# Patient Record
Sex: Female | Born: 1950 | Race: White | Hispanic: No | Smoking: Never smoker
Health system: Southern US, Community
[De-identification: ages and names within clinical notes are randomized; demographics above are authoritative.]

## PROBLEM LIST (undated history)

## (undated) DIAGNOSIS — F419 Anxiety disorder, unspecified: Secondary | ICD-10-CM

## (undated) DIAGNOSIS — J45909 Unspecified asthma, uncomplicated: Secondary | ICD-10-CM

## (undated) DIAGNOSIS — G629 Polyneuropathy, unspecified: Secondary | ICD-10-CM

## (undated) DIAGNOSIS — G4733 Obstructive sleep apnea (adult) (pediatric): Secondary | ICD-10-CM

## (undated) DIAGNOSIS — F319 Bipolar disorder, unspecified: Secondary | ICD-10-CM

## (undated) DIAGNOSIS — H269 Unspecified cataract: Secondary | ICD-10-CM

## (undated) DIAGNOSIS — G4730 Sleep apnea, unspecified: Secondary | ICD-10-CM

## (undated) DIAGNOSIS — M722 Plantar fascial fibromatosis: Secondary | ICD-10-CM

## (undated) DIAGNOSIS — K7581 Nonalcoholic steatohepatitis (NASH): Secondary | ICD-10-CM

## (undated) DIAGNOSIS — I10 Essential (primary) hypertension: Secondary | ICD-10-CM

## (undated) DIAGNOSIS — I2510 Atherosclerotic heart disease of native coronary artery without angina pectoris: Secondary | ICD-10-CM

## (undated) DIAGNOSIS — R011 Cardiac murmur, unspecified: Secondary | ICD-10-CM

## (undated) DIAGNOSIS — M5430 Sciatica, unspecified side: Secondary | ICD-10-CM

## (undated) DIAGNOSIS — M810 Age-related osteoporosis without current pathological fracture: Secondary | ICD-10-CM

## (undated) DIAGNOSIS — E079 Disorder of thyroid, unspecified: Secondary | ICD-10-CM

## (undated) DIAGNOSIS — L501 Idiopathic urticaria: Secondary | ICD-10-CM

## (undated) DIAGNOSIS — E7800 Pure hypercholesterolemia, unspecified: Secondary | ICD-10-CM

## (undated) DIAGNOSIS — I219 Acute myocardial infarction, unspecified: Secondary | ICD-10-CM

## (undated) DIAGNOSIS — F32A Depression, unspecified: Secondary | ICD-10-CM

## (undated) DIAGNOSIS — T7840XA Allergy, unspecified, initial encounter: Secondary | ICD-10-CM

## (undated) HISTORY — DX: Sciatica, unspecified side: M54.30

## (undated) HISTORY — DX: Atherosclerotic heart disease of native coronary artery without angina pectoris: I25.10

## (undated) HISTORY — DX: Sleep apnea, unspecified: G47.30

## (undated) HISTORY — DX: Unspecified asthma, uncomplicated: J45.909

## (undated) HISTORY — DX: Allergy, unspecified, initial encounter: T78.40XA

## (undated) HISTORY — DX: Bipolar disorder, unspecified: F31.9

## (undated) HISTORY — DX: Plantar fascial fibromatosis: M72.2

## (undated) HISTORY — DX: Acute myocardial infarction, unspecified: I21.9

## (undated) HISTORY — PX: TONSILLECTOMY: SUR1361

## (undated) HISTORY — DX: Cardiac murmur, unspecified: R01.1

## (undated) HISTORY — DX: Polyneuropathy, unspecified: G62.9

## (undated) HISTORY — PX: OTHER SURGICAL HISTORY: SHX169

## (undated) HISTORY — DX: Idiopathic urticaria: L50.1

## (undated) HISTORY — DX: Age-related osteoporosis without current pathological fracture: M81.0

## (undated) HISTORY — DX: Unspecified cataract: H26.9

## (undated) HISTORY — DX: Nonalcoholic steatohepatitis (NASH): K75.81

## (undated) HISTORY — PX: DILATION AND CURETTAGE OF UTERUS: SHX78

## (undated) HISTORY — DX: Obstructive sleep apnea (adult) (pediatric): G47.33

## (undated) HISTORY — PX: APPENDECTOMY: SHX54

---

## 2018-05-07 LAB — HM COLONOSCOPY

## 2018-05-09 HISTORY — PX: LEG SURGERY: SHX1003

## 2019-04-18 LAB — HM MAMMOGRAPHY

## 2019-06-11 ENCOUNTER — Encounter: Payer: Self-pay | Admitting: Pulmonary Disease

## 2019-06-19 ENCOUNTER — Telehealth: Payer: Self-pay | Admitting: Family

## 2019-06-19 NOTE — Telephone Encounter (Signed)
Received Medical Records by fax on 06-19-2019, Medical records given to [facility] for pt's appt who is establishing on 11-08-2019.

## 2019-06-19 NOTE — Telephone Encounter (Signed)
Records will be reviewed before appointment date.

## 2019-06-23 ENCOUNTER — Ambulatory Visit: Payer: Medicare Other | Attending: Internal Medicine

## 2019-06-23 DIAGNOSIS — Z23 Encounter for immunization: Secondary | ICD-10-CM | POA: Insufficient documentation

## 2019-06-23 NOTE — Progress Notes (Signed)
   Covid-19 Vaccination Clinic  Name:  Rebecca Lutz    MRN: [id number] DOB: 1950/07/10  06/23/2019  [name] was observed post Covid-19 immunization for 15 minutes without incidence. She was provided with Vaccine Information Sheet and instruction to access the V-Safe system.   [name] was instructed to call 911 with any severe reactions post vaccine: Marland Kitchen Difficulty breathing  . Swelling of your face and throat  . A fast heartbeat  . A bad rash all over your body  . Dizziness and weakness    Immunizations Administered    Name Date Dose VIS Date Route   Pfizer COVID-19 Vaccine 06/23/2019  2:15 PM 0.3 mL 04/19/2019 Intramuscular   Manufacturer: [place]   Lot: [id number]   [place]: [address]-1

## 2019-06-27 ENCOUNTER — Institutional Professional Consult (permissible substitution): Payer: Medicare Other | Admitting: Pulmonary Disease

## 2019-07-15 ENCOUNTER — Other Ambulatory Visit: Payer: Self-pay

## 2019-07-15 ENCOUNTER — Encounter: Payer: Self-pay | Admitting: Pulmonary Disease

## 2019-07-15 ENCOUNTER — Ambulatory Visit (INDEPENDENT_AMBULATORY_CARE_PROVIDER_SITE_OTHER): Payer: Medicare Other | Admitting: Pulmonary Disease

## 2019-07-15 VITALS — BP 138/64 | HR 89 | Temp 97.1°F | Ht 61.0 in | Wt 140.8 lb

## 2019-07-15 DIAGNOSIS — G4733 Obstructive sleep apnea (adult) (pediatric): Secondary | ICD-10-CM | POA: Diagnosis not present

## 2019-07-15 NOTE — Patient Instructions (Signed)
History of sleep onset and sleep maintenance insomnia  Klonopin 0.5 mg seems to help This was initially prescribed for anxiety but helps sleep  Be more liberal with use of Klonopin at 0.5 mg  Continue using his CPAP on a regular basis  I will see you in about 2 months  If you find that the Klonopin is not working as well, we can always replace it with another agent

## 2019-07-15 NOTE — Progress Notes (Signed)
Subjective:    Patient ID: Rebecca Lutz, female    DOB: Dec 22, 1950, 69 y.o.   MRN: [id number]  Patient with a history of obstructive sleep apnea-diagnosed about 15 years ago probably moderate obstructive sleep apnea Uses CPAP on a regular basis Did not notice significant changes in her symptoms  Has sleep onset and sleep maintenance insomnia Usually has some difficulty falling asleep, sleeps about 3 to 4 hours and then wakes up might take about 30 minutes to fall back asleep  3-4 awakenings at night  Final wake up time 7 AM to 8 AM  She feels she functions well during the day  Does not take daytime naps  She does have some dryness of the mouth with CPAP use We discussed using a chinstrap to help  Klonopin does appear to work well, she is concerned about dependency on it but this does help her sleep well Denies any significant side effects from using Klonopin  No past medical history on file. Social History   Socioeconomic History  . Marital status: Widowed    Spouse name: Not on file  . Number of children: Not on file  . Years of education: Not on file  . Highest education level: Not on file  Occupational History  . Not on file  Tobacco Use  . Smoking status: Never Smoker  . Smokeless tobacco: Never Used  Substance and Sexual Activity  . Alcohol use: Not on file  . Drug use: Not on file  . Sexual activity: Not on file  Other Topics Concern  . Not on file  Social History Narrative  . Not on file   Social Determinants of Health   Financial Resource Strain:   . Difficulty of Paying Living Expenses: Not on file  Food Insecurity:   . Worried About Charity fundraiser in the Last Year: Not on file  . Ran Out of Food in the Last Year: Not on file  Transportation Needs:   . Lack of Transportation (Medical): Not on file  . Lack of Transportation (Non-Medical): Not on file  Physical Activity:   . Days of Exercise per Week: Not on file  . Minutes of Exercise  per Session: Not on file  Stress:   . Feeling of Stress : Not on file  Social Connections:   . Frequency of Communication with Friends and Family: Not on file  . Frequency of Social Gatherings with Friends and Family: Not on file  . Attends Religious Services: Not on file  . Active Member of Clubs or Organizations: Not on file  . Attends Archivist Meetings: Not on file  . Marital Status: Not on file  Intimate Partner Violence:   . Fear of Current or Ex-Partner: Not on file  . Emotionally Abused: Not on file  . Physically Abused: Not on file  . Sexually Abused: Not on file   No family history on file.  Review of Systems  Constitutional: Positive for unexpected weight change. Negative for fever.  HENT: Negative for congestion, dental problem, ear pain, nosebleeds, postnasal drip, rhinorrhea, sinus pressure, sneezing, sore throat and trouble swallowing.   Eyes: Negative for redness and itching.  Respiratory: Negative for cough, chest tightness, shortness of breath and wheezing.   Cardiovascular: Negative for palpitations and leg swelling.  Gastrointestinal: Negative for nausea and vomiting.  Genitourinary: Negative for dysuria.  Musculoskeletal: Negative for joint swelling.  Skin: Negative for rash.  Allergic/Immunologic: Negative.  Negative for environmental allergies, food allergies and  immunocompromised state.  Neurological: Negative for headaches.  Hematological: Does not bruise/bleed easily.  Psychiatric/Behavioral: Positive for dysphoric mood. The patient is nervous/anxious.       Objective:   Physical Exam Vitals reviewed.  Constitutional:      Appearance: Normal appearance.  HENT:     Head: Normocephalic.     Right Ear: Tympanic membrane normal.     Nose: Nose normal.     Mouth/Throat:     Mouth: Mucous membranes are moist.  Eyes:     Extraocular Movements: Extraocular movements intact.     Pupils: Pupils are equal, round, and reactive to light.    Cardiovascular:     Rate and Rhythm: Normal rate and regular rhythm.     Pulses: Normal pulses.     Heart sounds: Normal heart sounds. No murmur. No friction rub.  Pulmonary:     Effort: Pulmonary effort is normal. No respiratory distress.     Breath sounds: Normal breath sounds. No stridor. No rhonchi.  Musculoskeletal:        General: Normal range of motion.     Cervical back: Normal range of motion and neck supple. No rigidity or tenderness.  Skin:    General: Skin is warm.  Neurological:     General: No focal deficit present.     Mental Status: She is alert.    Vitals:   07/15/19 1440  BP: 138/64  Pulse: 89  Temp: (!) 97.1 F (36.2 C)  SpO2: 96%   Results of the Epworth flowsheet 07/15/2019  Sitting and reading 2  Watching TV 1  Sitting, inactive in a public place (e.g. a theatre or a meeting) 0  As a passenger in a car for an hour without a break 0  Lying down to rest in the afternoon when circumstances permit 3  Sitting and talking to someone 0  Sitting quietly after a lunch without alcohol 2  In a car, while stopped for a few minutes in traffic 0  Total score 8      Assessment & Plan:  .  Obstructive sleep apnea-appears well treated with CPAP therapy at present  .  Sleep onset and sleep maintenance insomnia -Patient currently uses Klonopin for anxiety and this seems to help her insomnia  Plan: I suggested that she continue using Klonopin as this does help her insomnia  Regular exercises will help sleep quality as well  Continue using CPAP on a regular basis I did discuss repeating study if she has questions regarding whether she still needs to be on CPAP Her machine is dated but seems to still be working well at present  We will see her back in about 3 months Encouraged to call with any significant concerns

## 2019-07-16 ENCOUNTER — Ambulatory Visit: Payer: Self-pay | Attending: Internal Medicine

## 2019-07-16 DIAGNOSIS — Z23 Encounter for immunization: Secondary | ICD-10-CM | POA: Insufficient documentation

## 2019-07-16 NOTE — Progress Notes (Signed)
   Covid-19 Vaccination Clinic  Name:  Rebecca Lutz    MRN: [id number] DOB: [birth year]  07/16/2019  [name] was observed post Covid-19 immunization for 15 minutes without incident. She was provided with Vaccine Information Sheet and instruction to access the V-Safe system.   [name] was instructed to call 911 with any severe reactions post vaccine: Marland Kitchen Difficulty breathing  . Swelling of face and throat  . A fast heartbeat  . A bad rash all over body  . Dizziness and weakness   Immunizations Administered    Name Date Dose VIS Date Route   Pfizer COVID-19 Vaccine 07/16/2019  3:35 PM 0.3 mL 04/19/2019 Intramuscular   Manufacturer: [place]   Lot: [id number]   [place]: [id number]

## 2019-07-17 ENCOUNTER — Ambulatory Visit: Payer: Medicare Other

## 2019-09-16 ENCOUNTER — Ambulatory Visit: Payer: Medicare Other | Admitting: Pulmonary Disease

## 2019-09-18 ENCOUNTER — Ambulatory Visit (INDEPENDENT_AMBULATORY_CARE_PROVIDER_SITE_OTHER): Payer: Medicare Other | Admitting: Pulmonary Disease

## 2019-09-18 ENCOUNTER — Other Ambulatory Visit: Payer: Self-pay

## 2019-09-18 ENCOUNTER — Encounter: Payer: Self-pay | Admitting: Pulmonary Disease

## 2019-09-18 VITALS — BP 128/60 | HR 61 | Temp 97.6°F | Ht 61.0 in | Wt 137.6 lb

## 2019-09-18 DIAGNOSIS — G4733 Obstructive sleep apnea (adult) (pediatric): Secondary | ICD-10-CM | POA: Insufficient documentation

## 2019-09-18 DIAGNOSIS — G4700 Insomnia, unspecified: Secondary | ICD-10-CM

## 2019-09-18 MED ORDER — CLONAZEPAM 0.5 MG PO TABS: 0.5000 mg | ORAL_TABLET | Freq: Every day | ORAL | 0 refills | Status: DC

## 2019-09-18 NOTE — Assessment & Plan Note (Signed)
Plan: Continue Klonopin as needed for insomnia Informed patient that she will need to notify us monthly for refills regarding this so [place] PMP aware can be reviewed, she understands Emphasized that this is not an emergency and she needs to provide at least 7 days notice prior to running out of her medications [place] PMP aware reviewed  Follow-up in 6 months with Dr. Ander Slade

## 2019-09-18 NOTE — Patient Instructions (Addendum)
You were seen today by Lauraine Rinne, NP  for:   1. OSA (obstructive sleep apnea)  We recommend that you continue using your CPAP daily >>>Keep up the hard work using your device >>> Goal should be wearing this for the entire night that you are sleeping, at least 4 to 6 hours  Remember:  . Do not drive or operate heavy machinery if tired or drowsy.  . Please notify the supply company and office if you are unable to use your device regularly due to missing supplies or machine being broken.  . Work on maintaining a healthy weight and following your recommended nutrition plan  . Maintain proper daily exercise and movement  . Maintaining proper use of your device can also help improve management of other chronic illnesses such as: Blood pressure, blood sugars, and weight management.   BiPAP/ CPAP Cleaning:  >>>Clean weekly, with Dawn soap, and bottle brush.  Set up to air dry. >>> Wipe mask out daily with wet wipe or towelette   2. Insomnia, unspecified type  - clonazePAM (KLONOPIN) 0.5 MG tablet; Take 1 tablet (0.5 mg total) by mouth at bedtime. Takes 1/2 to 1 tabs twice daily as needed  Dispense: 30 tablet; Refill: 0  Okay to remain on Klonopin taking 0.5 mg tablet at night as needed for insomnia  Please give our office a call requesting a refill in 28 days or so to provide [place] at least 7 days to refill.  We recommend today:   Meds ordered this encounter  Medications  . clonazePAM (KLONOPIN) 0.5 MG tablet    Sig: Take 1 tablet (0.5 mg total) by mouth at bedtime. Takes 1/2 to 1 tabs twice daily as needed    Dispense:  30 tablet    Refill:  0    Follow Up:    Return in about 6 months (around 03/20/2020), or if symptoms worsen or fail to improve, for Follow up with Dr. Ander Slade.   Please do your part to reduce the spread of COVID-19:      Reduce your risk of any infection  and COVID19 by using the similar precautions used for avoiding the common cold or flu:  Marland Kitchen Wash your  hands often with soap and warm water for at least 20 seconds.  If soap and water are not readily available, use an alcohol-based hand sanitizer with at least 60% alcohol.  . If coughing or sneezing, cover your mouth and nose by coughing or sneezing into the elbow areas of your shirt or coat, into a tissue or into your sleeve (not your hands). Langley Gauss A MASK when in public  . Avoid shaking hands with others and consider head nods or verbal greetings only. . Avoid touching your eyes, nose, or mouth with unwashed hands.  . Avoid close contact with people who are sick. . Avoid places or events with large numbers of people in one location, like concerts or sporting events. . If you have some symptoms but not all symptoms, continue to monitor at home and seek medical attention if your symptoms worsen. . If you are having a medical emergency, call 911.   [place] / e-Visit: [email or web address]         [facility]: [facility]: [phone]                   [facility]: [phone]  It is flu season:   >>> Best ways to protect herself from the flu: Receive the yearly flu vaccine, practice good hand hygiene washing with soap and also using hand sanitizer when available, eat a nutritious meals, get adequate rest, hydrate appropriately   Please contact the office if your symptoms worsen or you have concerns that you are not improving.   Thank you for choosing [facility] for your healthcare, and for allowing [place] to partner with you on your healthcare journey. I am thankful to be able to provide care to you today.   Wyn Quaker FNP-C

## 2019-09-18 NOTE — Assessment & Plan Note (Signed)
Plan: Continue CPAP therapy Follow-up in 6 months

## 2019-09-18 NOTE — Progress Notes (Signed)
@Patient  ID: Rebecca Lutz, female    DOB: 23-Feb-1951, 69 y.o.   MRN: [id number]  Chief Complaint  Patient presents with  . Follow-up    Doing well no complaints. Wears avg. of 5 hrs. each night    Referring provider: Debbrah Alar, NP  HPI:  Evan-year-old female never smoker followed in our office for obstructive sleep apnea  PMH: Insomnia  Smoker/ Smoking History: Never Smoker Maintenance:  None  Pt of: Dr. Ander Slade  09/18/2019  - Visit   69 year old female never smoker followed in our office for obstructive sleep apnea.  Patient complaining follow-up with our office today.  She was last seen in our office in March/2021.  At that time it was suggested that she remain on Klonopin for management of insomnia.  Continue to use CPAP.  And she was requested to have a follow-up in 3 months.  Patient presenting to office today reporting that she is doing well.  She reports compliance with using her CPAP.  She reports no issues with it.  She does continue to have dry mouth.  She tried the chinstrap as suggested at last office visit.  She felt that it worsened claustrophobia.  She does not use chinstrap and she continues to have dry mouth which she says she will live with.  She continues to use Klonopin at night about 4-5 times a week.  She is requesting a refill of this today.  She has been maintained on this for some time.  Questionaires / Pulmonary Flowsheets:   MMRC: mMRC Dyspnea Scale mMRC Score  09/18/2019 0    Epworth:  Results of the Epworth flowsheet 07/15/2019  Sitting and reading 2  Watching TV 1  Sitting, inactive in a public place (e.g. a theatre or a meeting) 0  As a passenger in a car for an hour without a break 0  Lying down to rest in the afternoon when circumstances permit 3  Sitting and talking to someone 0  Sitting quietly after a lunch without alcohol 2  In a car, while stopped for a few minutes in traffic 0  Total score 8    Tests:   FENO:  No  results found for: NITRICOXIDE  PFT: No flowsheet data found.  WALK:  No flowsheet data found.  Imaging: No results found.  Lab Results:  CBC No results found for: WBC, RBC, HGB, HCT, PLT, MCV, MCH, MCHC, RDW, LYMPHSABS, MONOABS, EOSABS, BASOSABS  BMET No results found for: NA, K, CL, CO2, GLUCOSE, BUN, CREATININE, CALCIUM, GFRNONAA, GFRAA  BNP No results found for: BNP  ProBNP No results found for: PROBNP  Specialty Problems      Pulmonary Problems   OSA (obstructive sleep apnea)      Allergies  Allergen Reactions  . Penicillins     Immunization History  Administered Date(s) Administered  . Fluad Quad(high Dose 65+) 02/20/2019  . PFIZER SARS-COV-2 Vaccination 06/23/2019, 07/16/2019    History reviewed. No pertinent past medical history.  Tobacco History: Social History   Tobacco Use  Smoking Status Never Smoker  Smokeless Tobacco Never Used   Counseling given: Not Answered   Continue to not smoke  Outpatient Encounter Medications as of 09/18/2019  Medication Sig  . aspirin EC 81 MG tablet Take 81 mg by mouth daily.  Marland Kitchen atorvastatin (LIPITOR) 80 MG tablet Take 80 mg by mouth daily.  . clonazePAM (KLONOPIN) 0.5 MG tablet Take 1 tablet (0.5 mg total) by mouth at bedtime. Takes 1/2 to 1 tabs  twice daily as needed  . ezetimibe (ZETIA) 10 MG tablet Take 10 mg by mouth daily.  Marland Kitchen levothyroxine (SYNTHROID) 125 MCG tablet Take 125 mcg by mouth daily before breakfast. 1 tab- Sun- Fri 1/ 2 tab Sat  . metoprolol succinate (TOPROL-XL) 25 MG 24 hr tablet Take 25 mg by mouth daily.  . Misc Natural Products (LUTEIN VISION BLEND PO) Take by mouth daily.  . Multiple Vitamins-Minerals (VITAMIN D3 COMPLETE PO) Take 1,000 Units by mouth daily.  . sertraline (ZOLOFT) 100 MG tablet Take 100 mg by mouth daily.  . [DISCONTINUED] clonazePAM (KLONOPIN) 0.5 MG tablet Take 0.5 mg by mouth. Takes 1/2 to 1 tabs twice daily as needed   No facility-administered encounter  medications on file as of 09/18/2019.     Review of Systems  Review of Systems  Constitutional: Negative for activity change, fatigue and fever.  HENT: Negative for sinus pressure, sinus pain and sore throat.   Respiratory: Negative for cough, shortness of breath and wheezing.   Cardiovascular: Negative for chest pain and palpitations.  Gastrointestinal: Negative for diarrhea, nausea and vomiting.  Musculoskeletal: Negative for arthralgias.  Neurological: Negative for dizziness.  Psychiatric/Behavioral: Negative for sleep disturbance. The patient is not nervous/anxious.      Physical Exam  BP 128/60 (BP Location: Left Arm, Cuff Size: Normal)   Pulse 61   Temp 97.6 F (36.4 C) (Temporal)   Ht 5' 1"  (1.549 m)   Wt 137 lb 9.6 oz (62.4 kg)   SpO2 98%   BMI 26.00 kg/m   Wt Readings from Last 5 Encounters:  09/18/19 137 lb 9.6 oz (62.4 kg)  07/15/19 140 lb 12.8 oz (63.9 kg)    BMI Readings from Last 5 Encounters:  09/18/19 26.00 kg/m  07/15/19 26.60 kg/m     Physical Exam Vitals and nursing note reviewed.  Constitutional:      General: She is not in acute distress.    Appearance: Normal appearance. She is normal weight.  HENT:     Head: Normocephalic and atraumatic.     Right Ear: Tympanic membrane, ear canal and external ear normal. There is no impacted cerumen.     Left Ear: Tympanic membrane, ear canal and external ear normal. There is no impacted cerumen.     Nose: Nose normal. No congestion.     Mouth/Throat:     Mouth: Mucous membranes are moist.     Pharynx: Oropharynx is clear.  Eyes:     Pupils: Pupils are equal, round, and reactive to light.  Cardiovascular:     Rate and Rhythm: Normal rate and regular rhythm.     Pulses: Normal pulses.     Heart sounds: Normal heart sounds. No murmur.  Pulmonary:     Effort: Pulmonary effort is normal. No respiratory distress.     Breath sounds: Normal breath sounds. No decreased air movement. No decreased breath  sounds, wheezing or rales.  Musculoskeletal:     Cervical back: Normal range of motion.  Skin:    General: Skin is warm and dry.     Capillary Refill: Capillary refill takes less than 2 seconds.  Neurological:     General: No focal deficit present.     Mental Status: She is alert and oriented to person, place, and time. Mental status is at baseline.     Gait: Gait normal.  Psychiatric:        Mood and Affect: Mood normal.        Behavior: Behavior normal.  Thought Content: Thought content normal.        Judgment: Judgment normal.       Assessment & Plan:   OSA (obstructive sleep apnea) Plan: Continue CPAP therapy Follow-up in 6 months  Insomnia Plan: Continue Klonopin as needed for insomnia Informed patient that she will need to notify us monthly for refills regarding this so [place] PMP aware can be reviewed, she understands Emphasized that this is not an emergency and she needs to provide at least 7 days notice prior to running out of her medications [place] PMP aware reviewed  Follow-up in 6 months with Dr. Ander Slade    Return in about 6 months (around 03/20/2020), or if symptoms worsen or fail to improve, for Follow up with Dr. Ander Slade.   Lauraine Rinne, NP 09/18/2019   This appointment required 24 minutes of patient care (this includes precharting, chart review, review of results, face-to-face care, etc.).

## 2019-10-10 ENCOUNTER — Telehealth: Payer: Self-pay | Admitting: Pulmonary Disease

## 2019-10-10 DIAGNOSIS — G4700 Insomnia, unspecified: Secondary | ICD-10-CM

## 2019-10-10 NOTE — Telephone Encounter (Signed)
Hey Dr Gala Murdoch, please advise if okay to refill the clonazepam Last filled on 09/18/19:. clonazePAM (KLONOPIN) 0.5 MG tablet     Sig: Take 1 tablet (0.5 mg total) by mouth at bedtime. Takes 1/2 to 1 tabs twice daily as needed     Dispense:  30 tablet     Refill:  0

## 2019-10-10 NOTE — Telephone Encounter (Signed)
Okay to fill? 

## 2019-10-11 MED ORDER — CLONAZEPAM 0.5 MG PO TABS: ORAL_TABLET | ORAL | 0 refills | Status: DC

## 2019-10-11 NOTE — Telephone Encounter (Signed)
Rx has been called in per Dr. Ander Slade. Pt is aware. Nothing further was needed.

## 2019-10-16 ENCOUNTER — Emergency Department (HOSPITAL_BASED_OUTPATIENT_CLINIC_OR_DEPARTMENT_OTHER)
Admission: EM | Admit: 2019-10-16 | Discharge: 2019-10-16 | Disposition: A | Discharge status: Home / Self Care | Payer: Medicare Other | Attending: Emergency Medicine | Admitting: Emergency Medicine

## 2019-10-16 ENCOUNTER — Other Ambulatory Visit: Payer: Self-pay

## 2019-10-16 ENCOUNTER — Emergency Department (HOSPITAL_BASED_OUTPATIENT_CLINIC_OR_DEPARTMENT_OTHER): Payer: Medicare Other

## 2019-10-16 ENCOUNTER — Encounter (HOSPITAL_BASED_OUTPATIENT_CLINIC_OR_DEPARTMENT_OTHER): Payer: Self-pay | Admitting: Emergency Medicine

## 2019-10-16 DIAGNOSIS — W010XXA Fall on same level from slipping, tripping and stumbling without subsequent striking against object, initial encounter: Secondary | ICD-10-CM | POA: Insufficient documentation

## 2019-10-16 DIAGNOSIS — I10 Essential (primary) hypertension: Secondary | ICD-10-CM | POA: Insufficient documentation

## 2019-10-16 DIAGNOSIS — Y999 Unspecified external cause status: Secondary | ICD-10-CM | POA: Diagnosis not present

## 2019-10-16 DIAGNOSIS — Y939 Activity, unspecified: Secondary | ICD-10-CM | POA: Diagnosis not present

## 2019-10-16 DIAGNOSIS — S92355A Nondisplaced fracture of fifth metatarsal bone, left foot, initial encounter for closed fracture: Secondary | ICD-10-CM

## 2019-10-16 DIAGNOSIS — Y929 Unspecified place or not applicable: Secondary | ICD-10-CM | POA: Insufficient documentation

## 2019-10-16 DIAGNOSIS — S99922A Unspecified injury of left foot, initial encounter: Secondary | ICD-10-CM | POA: Diagnosis present

## 2019-10-16 DIAGNOSIS — Z7982 Long term (current) use of aspirin: Secondary | ICD-10-CM | POA: Diagnosis not present

## 2019-10-16 DIAGNOSIS — I2510 Atherosclerotic heart disease of native coronary artery without angina pectoris: Secondary | ICD-10-CM | POA: Diagnosis not present

## 2019-10-16 HISTORY — DX: Essential (primary) hypertension: I10

## 2019-10-16 HISTORY — DX: Pure hypercholesterolemia, unspecified: E78.00

## 2019-10-16 HISTORY — DX: Depression, unspecified: F32.A

## 2019-10-16 HISTORY — DX: Disorder of thyroid, unspecified: E07.9

## 2019-10-16 HISTORY — DX: Anxiety disorder, unspecified: F41.9

## 2019-10-16 NOTE — ED Triage Notes (Signed)
She twisted her L foot and felt a pop. Swelling noted.

## 2019-10-16 NOTE — ED Provider Notes (Signed)
[facility] EMERGENCY DEPARTMENT Provider Note   CSN: [id number] Arrival date & time: 10/16/19  0913     History Chief Complaint  Patient presents with   Foot Pain    Rebecca Lutz is a 69 y.o. female.  HPI Patient is a 69 year old female with a medical history as noted below.  She experienced a mechanical fall earlier today.  She states she was ambulating and twisted her left foot resulting in a fall to the ground.  She takes 81 mg of aspirin per day but denies any other anticoagulation.  She denies any head trauma or loss of consciousness.  She reports some mild swelling and pain to the left lateral foot.  Her pain worsens with ambulation.  Her pain is mild at rest.  She states she heard a "pop" in her foot when she fell.  She denies any ankle pain.  She denies fevers, chills, chest pain, shortness of breath, dizziness, lightheadedness.    Past Medical History:  Diagnosis Date   Anxiety    Coronary artery disease    Depression    High cholesterol    Hypertension    Thyroid disease     Patient Active Problem List   Diagnosis Date Noted   OSA (obstructive sleep apnea) 09/18/2019   Insomnia 09/18/2019    Past Surgical History:  Procedure Laterality Date   APPENDECTOMY     DILATION AND CURETTAGE OF UTERUS     TONSILLECTOMY       OB History   No obstetric history on file.     No family history on file.  Social History   Tobacco Use   Smoking status: Never Smoker   Smokeless tobacco: Never Used  Substance Use Topics   Alcohol use: Yes   Drug use: Never    Home Medications Prior to Admission medications   Medication Sig Start Date End Date Taking? Authorizing Provider  aspirin EC 81 MG tablet Take 81 mg by mouth daily.    [provider]  atorvastatin (LIPITOR) 80 MG tablet Take 80 mg by mouth daily.    [provider]  clonazePAM (KLONOPIN) 0.5 MG tablet Takes 1/2 to 1 tabs twice daily as needed 10/11/19    Sherrilyn Rist A, MD  ezetimibe (ZETIA) 10 MG tablet Take 10 mg by mouth daily.    [provider]  levothyroxine (SYNTHROID) 125 MCG tablet Take 125 mcg by mouth daily before breakfast. 1 tab- Sun- Fri 1/ 2 tab Sat    [provider]  metoprolol succinate (TOPROL-XL) 25 MG 24 hr tablet Take 25 mg by mouth daily.    [provider]  Misc Natural Products (LUTEIN VISION BLEND PO) Take by mouth daily.    [provider]  Multiple Vitamins-Minerals (VITAMIN D3 COMPLETE PO) Take 1,000 Units by mouth daily.    [provider]  sertraline (ZOLOFT) 100 MG tablet Take 100 mg by mouth daily.    [provider]    Allergies    Penicillins  Review of Systems   Review of Systems  All other systems reviewed and are negative. Ten systems reviewed and are negative for acute change, except as noted in the HPI.   Physical Exam Updated Vital Signs BP (!) 143/77 (BP Location: Right Arm)    Pulse 80    Temp 98.2 F (36.8 C) (Oral)    Resp 18    Ht 5' 1"  (1.549 m)    Wt 63 kg  SpO2 100%    BMI 26.26 kg/m   Physical Exam Vitals and nursing note reviewed.  Constitutional:      General: She is not in acute distress.    Appearance: Normal appearance. She is normal weight. She is not ill-appearing, toxic-appearing or diaphoretic.  HENT:     Head: Normocephalic and atraumatic.     Right Ear: External ear normal.     Left Ear: External ear normal.     Nose: Nose normal.     Mouth/Throat:     Mouth: Mucous membranes are moist.     Pharynx: Oropharynx is clear. No oropharyngeal exudate or posterior oropharyngeal erythema.  Eyes:     Extraocular Movements: Extraocular movements intact.  Cardiovascular:     Rate and Rhythm: Normal rate and regular rhythm.     Pulses: Normal pulses.     Heart sounds: Normal heart sounds. No murmur. No friction rub. No gallop.   Pulmonary:     Effort: Pulmonary effort is normal. No respiratory distress.     Breath  sounds: Normal breath sounds. No stridor. No wheezing, rhonchi or rales.  Abdominal:     General: Abdomen is flat.     Tenderness: There is no abdominal tenderness.  Musculoskeletal:        General: Swelling and tenderness present. No deformity. Normal range of motion.     Cervical back: Normal range of motion and neck supple. No tenderness.     Comments: Mild TTP with a moderately sized hematoma noted overlying the left fifth metatarsal.  Patient able to move all toes of the left foot without difficulty.  Distal sensation intact in all toes of the left foot.  Palpable DP pulses noted bilaterally.  Full range of motion of the left ankle.  No erythema, edema, ecchymosis noted surrounding the left ankle.  Skin:    General: Skin is warm and dry.  Neurological:     General: No focal deficit present.     Mental Status: She is alert and oriented to person, place, and time.  Psychiatric:        Mood and Affect: Mood normal.        Behavior: Behavior normal.    ED Results / Procedures / Treatments   Labs (all labs ordered are listed, but only abnormal results are displayed) Labs Reviewed - No data to display  EKG None  Radiology DG Foot Complete Left  Result Date: 10/16/2019 CLINICAL DATA:  Pain following fall EXAM: LEFT FOOT - COMPLETE 3+ VIEW COMPARISON:  None. FINDINGS: Frontal, oblique, and lateral views were obtained. There is a fracture along the proximal aspect of the fifth metatarsal with slight separation of fracture fragments. No other evident fracture. No dislocation. The joint spaces appear normal. There is a posterior calcaneal spur. IMPRESSION: Fracture proximal fifth metatarsal with mild separation of fracture fragments in this area. No other fracture. No dislocation. No joint space narrowing. There is a posterior calcaneal spur. Electronically Signed   By: Lowella Grip III M.D.   On: 10/16/2019 10:10   Procedures Procedures   Medications Ordered in ED Medications - No  data to display  ED Course  I have reviewed the triage vital signs and the nursing notes.  Pertinent labs & imaging results that were available during my care of the patient were reviewed by me and considered in my medical decision making (see chart for details).  Clinical Course as of Oct 16 1046  Wed Oct 16, 2019  0917 Patient  is a 69 year old female who presents with left foot pain.  She has a moderate hematoma noted over the left fifth metatarsal.  Will obtain x-rays of the left foot.  Will reassess.  Patient was offered pain medications but declined at this time.   [LJ]  7209 X-ray is resulted fracture along the proximal aspect of the fifth metatarsal of the left foot with slight separation of fracture fragments.  No other evident fracture.  No dislocation.  The joint spaces appear normal.  There is a posterior calcaneal spur.  I discussed this with the patient.  I am giving her referral for orthopedics in the area.  I recommended that she follow-up with them today to discuss her fracture as well as neck steps.  She was placed in a cam boot.  I once again offered the patient pain medications and she declined.  She does not want a prescription for pain medications either.  We discussed ibuprofen and Tylenol at home as well as a proper regimen.  Her questions were answered and she was amicable at the time of discharge.  Her vital signs are stable.   [LJ]    Clinical Course User Index [LJ] Rayna Sexton, PA-C   MDM Rules/Calculators/A&P                      Patient is a 69 year old female with history, physical exam, ED course as noted above.   Patient was placed in a cam boot due to the fracture of her left fifth metatarsal.  Patient was given orthopedic follow-up.  She declined a prescription for pain medications.  We discussed Tylenol and ibuprofen as needed at home.  We discussed dosing.  Her questions were answered and she was amicable at the time of discharge.  Her vital signs are  stable.  Patient discharged to home/self care.  Condition at discharge: Stable  Note: Portions of this report may have been transcribed using voice recognition software. Every effort was made to ensure accuracy; however, inadvertent computerized transcription errors may be present.     Final Clinical Impression(s) / ED Diagnoses Final diagnoses:  Closed nondisplaced fracture of fifth metatarsal bone of left foot, initial encounter    Rx / DC Orders ED Discharge Orders    None       Rayna Sexton, PA-C 10/16/19 1048    Veryl Speak, MD 10/17/19 2306

## 2019-10-16 NOTE — Discharge Instructions (Addendum)
Per our discussion, please follow up with orthopaedics regarding your fracture.  You can take tylenol and ibuprofen as needed for management of your pain. Please follow the instructions on the bottle.  Please return to the emergency department if you develop any new or worsening symptoms.

## 2019-10-16 NOTE — ED Notes (Signed)
ED Provider at bedside. 

## 2019-10-18 ENCOUNTER — Telehealth: Payer: Self-pay | Admitting: Radiology

## 2019-10-18 NOTE — Telephone Encounter (Signed)
Received call on triage line requesting guidance on what to do for swelling of her foot. Patient was seen in the ED with 5th metatarsal fracture and placed in CAM boot. Patient was not instructed from ED in regards to ice and elevation so I advised. She has appointment to see Dr. Erlinda Hong next Tuesday. She will call if further questions/concerns.

## 2019-10-22 ENCOUNTER — Ambulatory Visit (INDEPENDENT_AMBULATORY_CARE_PROVIDER_SITE_OTHER): Payer: Medicare Other | Admitting: Orthopaedic Surgery

## 2019-10-22 DIAGNOSIS — S92355A Nondisplaced fracture of fifth metatarsal bone, left foot, initial encounter for closed fracture: Secondary | ICD-10-CM | POA: Diagnosis not present

## 2019-10-22 NOTE — Progress Notes (Signed)
Office Visit Note   Patient: Rebecca Lutz           Date of Birth: [birth year]           MRN: [id number] Visit Date: 10/22/2019              Requested by: Debbrah Alar, NP [address] [place],  [place] [id number] PCP: Debbrah Alar, NP   Assessment & Plan: Visit Diagnoses:  1. Nondisplaced fracture of fifth metatarsal bone, left foot, initial encounter for closed fracture     Plan: Impression is left fifth metatarsal base fracture.  We will immobilize and protect for another couple weeks in a cam boot and then she will transition to a postop shoe which we provided her today.  She may use crutches as needed based on symptoms.  Recheck in 4 weeks with three-view x-rays of the left foot.  She will continue to use over-the-counter medications as needed.  Follow-Up Instructions: Return in about 4 weeks (around 11/19/2019).   Orders:  No orders of the defined types were placed in this encounter.  No orders of the defined types were placed in this encounter.     Procedures: No procedures performed   Clinical Data: No additional findings.   Subjective: Chief Complaint  Patient presents with  . Left Foot - Pain    [name] is an ER follow-up for left foot fracture on 10/16/2010.  She missed a step and twisted her ankle.  She felt a pop in her left foot.  X-rays in the ER demonstrated a nondisplaced base of fifth metatarsal fracture.  She was placed in a cam boot.   Review of Systems  Constitutional: Negative.   HENT: Negative.   Eyes: Negative.   Respiratory: Negative.   Cardiovascular: Negative.   Endocrine: Negative.   Musculoskeletal: Negative.   Neurological: Negative.   Hematological: Negative.   Psychiatric/Behavioral: Negative.   All other systems reviewed and are negative.    Objective: Vital Signs: There were no vitals taken for this visit.  Physical Exam Vitals and nursing note reviewed.  Constitutional:      Appearance: She  is well-developed.  HENT:     Head: Normocephalic and atraumatic.  Pulmonary:     Effort: Pulmonary effort is normal.  Abdominal:     Palpations: Abdomen is soft.  Musculoskeletal:     Cervical back: Neck supple.  Skin:    General: Skin is warm.     Capillary Refill: Capillary refill takes less than 2 seconds.  Neurological:     Mental Status: She is alert and oriented to person, place, and time.  Psychiatric:        Behavior: Behavior normal.        Thought Content: Thought content normal.        Judgment: Judgment normal.     Ortho Exam Left foot shows a moderate swelling with tenderness at the base of fifth metatarsal.  No neurovascular compromise. Specialty Comments:  No specialty comments available.  Imaging: No results found.   PMFS History: Patient Active Problem List   Diagnosis Date Noted  . Nondisplaced fracture of fifth metatarsal bone, left foot, initial encounter for closed fracture 10/22/2019  . OSA (obstructive sleep apnea) 09/18/2019  . Insomnia 09/18/2019   Past Medical History:  Diagnosis Date  . Anxiety   . Coronary artery disease   . Depression   . High cholesterol   . Hypertension   . Thyroid disease  No family history on file.  Past Surgical History:  Procedure Laterality Date  . APPENDECTOMY    . DILATION AND CURETTAGE OF UTERUS    . TONSILLECTOMY     Social History   Occupational History  . Not on file  Tobacco Use  . Smoking status: Never Smoker  . Smokeless tobacco: Never Used  Vaping Use  . Vaping Use: Never used  Substance and Sexual Activity  . Alcohol use: Yes  . Drug use: Never  . Sexual activity: Not on file

## 2019-11-08 ENCOUNTER — Ambulatory Visit (INDEPENDENT_AMBULATORY_CARE_PROVIDER_SITE_OTHER): Payer: Medicare Other | Admitting: Family

## 2019-11-08 ENCOUNTER — Encounter: Payer: Self-pay | Admitting: Family

## 2019-11-08 ENCOUNTER — Telehealth: Payer: Self-pay | Admitting: Family

## 2019-11-08 ENCOUNTER — Other Ambulatory Visit: Payer: Self-pay

## 2019-11-08 VITALS — BP 128/62 | HR 64 | Temp 97.1°F | Resp 16 | Ht 61.0 in | Wt 144.4 lb

## 2019-11-08 DIAGNOSIS — E039 Hypothyroidism, unspecified: Secondary | ICD-10-CM

## 2019-11-08 DIAGNOSIS — I2510 Atherosclerotic heart disease of native coronary artery without angina pectoris: Secondary | ICD-10-CM | POA: Diagnosis not present

## 2019-11-08 DIAGNOSIS — F319 Bipolar disorder, unspecified: Secondary | ICD-10-CM

## 2019-11-08 DIAGNOSIS — L501 Idiopathic urticaria: Secondary | ICD-10-CM

## 2019-11-08 DIAGNOSIS — E1169 Type 2 diabetes mellitus with other specified complication: Secondary | ICD-10-CM

## 2019-11-08 DIAGNOSIS — I10 Essential (primary) hypertension: Secondary | ICD-10-CM

## 2019-11-08 DIAGNOSIS — K7581 Nonalcoholic steatohepatitis (NASH): Secondary | ICD-10-CM | POA: Diagnosis not present

## 2019-11-08 DIAGNOSIS — J302 Other seasonal allergic rhinitis: Secondary | ICD-10-CM

## 2019-11-08 DIAGNOSIS — E785 Hyperlipidemia, unspecified: Secondary | ICD-10-CM | POA: Diagnosis not present

## 2019-11-08 LAB — COMPREHENSIVE METABOLIC PANEL
ALT: 21 U/L (ref 0–35)
AST: 17 U/L (ref 0–37)
Albumin: 4.7 g/dL (ref 3.5–5.2)
Alkaline Phosphatase: 71 U/L (ref 39–117)
BUN: 20 mg/dL (ref 6–23)
CO2: 32 mEq/L (ref 19–32)
Calcium: 10 mg/dL (ref 8.4–10.5)
Chloride: 103 mEq/L (ref 96–112)
Creatinine, Ser: 0.65 mg/dL (ref 0.40–1.20)
GFR: 89.87 mL/min (ref >60.00)
Glucose, Bld: 98 mg/dL (ref 70–99)
Potassium: 4.7 mEq/L (ref 3.5–5.1)
Sodium: 140 mEq/L (ref 135–145)
Total Bilirubin: 0.9 mg/dL (ref 0.2–1.2)
Total Protein: 6.5 g/dL (ref 6.0–8.3)

## 2019-11-08 LAB — LIPID PANEL
Cholesterol: 151 mg/dL (ref 0–200)
HDL: 61.6 mg/dL (ref >39.00)
LDL Cholesterol: 69 mg/dL (ref 0–99)
NonHDL: 89.75
Total CHOL/HDL Ratio: 2
Triglycerides: 103 mg/dL (ref 0.0–149.0)
VLDL: 20.6 mg/dL (ref 0.0–40.0)

## 2019-11-08 LAB — TSH: TSH: 7.79 u[IU]/mL — ABNORMAL HIGH (ref 0.35–4.50)

## 2019-11-08 MED ORDER — LEVOTHYROXINE SODIUM 137 MCG PO TABS
137.0000 ug | ORAL_TABLET | Freq: Every day | ORAL | 5 refills | Status: DC
Start: 2019-11-08 — End: 2019-12-24

## 2019-11-08 NOTE — Patient Instructions (Addendum)
Please complete lab work prior to leaving.  Welcome to [facility]!

## 2019-11-08 NOTE — Progress Notes (Signed)
Subjective:    Patient ID: Rebecca Lutz, female    DOB: 07/14/1950, 69 y.o.   MRN: [id number]  HPI   Patient is a pleasant 69 yr old female who presents today to establish care. She is new to the area.  Moved from [place], [place].  Lost her husband last year and she wanted to be closer to her children.  Pmhx is significant for the following:  Allergies- maintained on claritin. Some seasonal allergies. Also has hx of idiopathic urticaria.  Last episode of hives was earlier this week.  Usually takes claritin in the spring only.    Hyperlipidemia- on lipitor 47m.  [facility]- maintained on zoloft 1019m She reports that her mood is stable.  Reports occasional anxiety attacks.    HTN- on metoprolol.  BP Readings from Last 3 Encounters:  11/08/19 128/62  10/16/19 (!) 154/74  09/18/19 128/60    Hypothyroid- maintained on synthroid 12565m Reports she was diagnosed [place] [address]  CAD- reports MI in 2007.  Reports that she was released by cardiology. She reports that it was considered a "fluke".  She had a cath at that time which was reportedly clear.  States cardiology has signed off.  Denies chest pain or shortness of breath.   OSA- sees Dr. OlaAnder SladeUses clonazepam prn insomnia.   She wears cpap.  She reports good compliance with her CPAP.  She reports at least a dozen tick bites in the past year.  She reports that she gets most of her tick bites when she takes her dog out for a walk.  She denies any fever, joint pain, or unusual rashes.  Review of Systems    see HPI  Past Medical History:  Diagnosis Date  . Anxiety   . CAD (coronary artery disease)   . Depression   . [place] High cholesterol   . Hypertension   . NASH (nonalcoholic steatohepatitis)   . Peripheral neuropathy   . Plantar fasciitis   . Sciatica   . Thyroid disease      Social History   Socioeconomic History  . Marital  status: Widowed    Spouse name: Not on file  . Number of children: Not on file  . Years of education: Not on file  . Highest education level: Not on file  Occupational History  . Not on file  Tobacco Use  . Smoking status: Never Smoker  . Smokeless tobacco: Never Used  Vaping Use  . Vaping Use: Never used  Substance and Sexual Activity  . Alcohol use: Yes  . Drug use: Never  . Sexual activity: Not Currently  Other Topics Concern  . Not on file  Social History Narrative   2 sons, 1 daughter (all in [place] Widowed 2020   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 Difficulty of Paying Living Expenses:   Food Insecurity:   . Worried About RunCharity fundraiser the Last Year:   . RanArboriculturist the Last Year:   Transportation Needs:   . LacFilm/video editoredical):   . LMarland Kitchenck of Transportation (Non-Medical):  Physical Activity:   . Days of Exercise per Week:   . Minutes of Exercise per Session:   Stress:   . Feeling of Stress :   Social Connections:   . Frequency of Communication with Friends and Family:   . Frequency of Social Gatherings with Friends and Family:   . Attends Religious Services:   . Active Member of Clubs or Organizations:   . Attends Archivist Meetings:   Marland Kitchen Marital Status:   Intimate Partner Violence:   . Fear of Current or Ex-Partner:   . Emotionally Abused:   Marland Kitchen Physically Abused:   . Sexually Abused:     Past Surgical History:  Procedure Laterality Date  . APPENDECTOMY    . DILATION AND CURETTAGE OF UTERUS    . idiopathic urticaria    . LEG SURGERY Right 2020  . NASH    . osteoporosis    . TONSILLECTOMY      Family History  Problem Relation Age of Onset  . Lung cancer Mother     . COPD Mother   . Suicidality Father        ? bipolar disorder  . Depression Father   . Cancer Daughter     Allergies  Allergen Reactions  . Penicillins     Current Outpatient Medications on File Prior to Visit  Medication Sig Dispense Refill  . aspirin EC 81 MG tablet Take 81 mg by mouth daily.    Marland Kitchen atorvastatin (LIPITOR) 80 MG tablet Take 80 mg by mouth daily.    . Cholecalciferol (VITAMIN D3) 25 MCG (1000 UT) CAPS Take by mouth.    . clonazePAM (KLONOPIN) 0.5 MG tablet Takes 1/2 to 1 tabs twice daily as needed 30 tablet 0  . ezetimibe (ZETIA) 10 MG tablet Take 10 mg by mouth daily.    Marland Kitchen levothyroxine (SYNTHROID) 125 MCG tablet Take 125 mcg by mouth daily before breakfast. 1 tab- Sun- Fri 1/ 2 tab Sat    . loratadine (CLARITIN) 10 MG tablet Take 10 mg by mouth daily.    . metoprolol succinate (TOPROL-XL) 25 MG 24 hr tablet Take 25 mg by mouth daily.    . Misc Natural Products (LUTEIN VISION BLEND PO) Take by mouth daily.    . sertraline (ZOLOFT) 100 MG tablet Take 100 mg by mouth daily.     No current facility-administered medications on file prior to visit.    BP 128/62 (BP Location: Right Arm, Patient Position: Sitting, Cuff Size: Small)   Pulse 64   Temp (!) 97.1 F (36.2 C) (Temporal)   Resp 16   Ht 5' 1"  (1.549 m)   Wt 144 lb 6.4 oz (65.5 kg)   SpO2 98%   BMI 27.28 kg/m    Objective:   Physical Exam Constitutional:      Appearance: She is well-developed.  Neck:     Thyroid: No thyromegaly.  Cardiovascular:     Rate and Rhythm: Normal rate and regular rhythm.     Heart sounds: Normal heart sounds. No murmur heard.   Pulmonary:     Effort: Pulmonary effort is normal. No respiratory distress.     Breath sounds: Normal breath sounds. No wheezing.  Musculoskeletal:     Cervical back: Neck supple.  Skin:    General: Skin is warm and dry.  Neurological:     Mental Status: She is alert and oriented to person, place, and time.  Psychiatric:         Behavior:  Behavior normal.        Thought Content: Thought content normal.        Judgment: Judgment normal.           Assessment & Plan:  Hyperlipidemia-tolerating statin.  Will obtain lipid panel.  Seasonal allergies/idiopathic urticaria-she only uses Claritin seasonally.  I did advise her that if she begins to develop more frequent urticaria that she can try adding the Claritin once daily to prevent the urticaria.  Bipolar depression-symptoms are currently stable on Zoloft.  Continue same.  Hypertension-blood pressure is stable on metoprolol.  Continue same.  History of CAD-continue secondary prevention with beta-blocker, aspirin, and statin.  She denies chest pain or shortness of breath.  History of tick bites-no concerning rash or symptoms at this time.  Hypothyroid- maintained on synthroid.  Check TSH. Clinically stable.  NASH- check LFT's.  Tick bites- recommended that she wear long pants/bug spray when walking her dog and call if she develops unusual rashes, joint pain or fever.  45 minutes spent on today's visit.  The majority of the time was spent reviewing pt's outside records, interviewing and counseling patient.  This visit occurred during the SARS-CoV-2 public health emergency.  Safety protocols were in place, including screening questions prior to the visit, additional usage of staff PPE, and extensive cleaning of exam room while observing appropriate contact time as indicated for disinfecting solutions.

## 2019-11-08 NOTE — Telephone Encounter (Signed)
Lab work shows synthroid should be increased. I would like her to d/c 137mg and start 137 mcg instead. Rx sent to her pharmacy. Check TSH in 6 weeks. Dx hypothyroid.

## 2019-11-08 NOTE — Telephone Encounter (Signed)
Patient advised of results and to pick up new dosage, she verbalized understanding, scheduled for tsh 12-23-2019

## 2019-11-11 ENCOUNTER — Encounter: Payer: Self-pay | Admitting: Family

## 2019-11-12 ENCOUNTER — Telehealth: Payer: Self-pay | Admitting: Pulmonary Disease

## 2019-11-12 NOTE — Telephone Encounter (Signed)
Dr. Ander Slade, patient requesting a refill of klonazapam.  Please advise.

## 2019-11-13 ENCOUNTER — Other Ambulatory Visit: Payer: Self-pay | Admitting: Pulmonary Disease

## 2019-11-13 DIAGNOSIS — G4700 Insomnia, unspecified: Secondary | ICD-10-CM

## 2019-11-13 MED ORDER — CLONAZEPAM 0.5 MG PO TABS: ORAL_TABLET | ORAL | 1 refills | Status: DC

## 2019-11-13 NOTE — Progress Notes (Signed)
Prescription sent in for clonazepam

## 2019-11-13 NOTE — Telephone Encounter (Signed)
Attempted to call pt but unable to reach. Left message for her to return call. 

## 2019-11-13 NOTE — Telephone Encounter (Signed)
Prescription sent in  

## 2019-11-14 NOTE — Telephone Encounter (Signed)
Spoke with pt. She is aware that her prescription has been sent in. Nothing further was needed. 

## 2019-11-19 ENCOUNTER — Ambulatory Visit (INDEPENDENT_AMBULATORY_CARE_PROVIDER_SITE_OTHER): Payer: Medicare Other | Admitting: Orthopaedic Surgery

## 2019-11-19 ENCOUNTER — Encounter: Payer: Self-pay | Admitting: Orthopaedic Surgery

## 2019-11-19 ENCOUNTER — Ambulatory Visit (INDEPENDENT_AMBULATORY_CARE_PROVIDER_SITE_OTHER): Payer: Medicare Other

## 2019-11-19 VITALS — Ht 61.0 in | Wt 144.0 lb

## 2019-11-19 DIAGNOSIS — S92355A Nondisplaced fracture of fifth metatarsal bone, left foot, initial encounter for closed fracture: Secondary | ICD-10-CM | POA: Diagnosis not present

## 2019-11-19 DIAGNOSIS — I2510 Atherosclerotic heart disease of native coronary artery without angina pectoris: Secondary | ICD-10-CM

## 2019-11-19 NOTE — Progress Notes (Signed)
Office Visit Note   Patient: Rebecca Lutz           Date of Birth: Apr 19, 1951           MRN: [id number] Visit Date: 11/19/2019              Requested by: Debbrah Alar, NP [address] [place],   [id number] PCP: Debbrah Alar, NP   Assessment & Plan: Visit Diagnoses:  1. Nondisplaced fracture of fifth metatarsal bone, left foot, initial encounter for closed fracture     Plan: At this point she can wean out of the postop shoe and into sneakers.  She is ambulating well with the sneakers.  We will recheck her in 4 weeks with three-view x-rays of the left foot.  Activity limitations were reviewed today.  Follow-Up Instructions: Return in about 4 weeks (around 12/17/2019).   Orders:  Orders Placed This Encounter  Procedures   XR Foot Complete Left   No orders of the defined types were placed in this encounter.     Procedures: No procedures performed   Clinical Data: No additional findings.   Subjective: Chief Complaint  Patient presents with   Left Foot - Follow-up    Left foot 5th metatarsal fracture    Patient returns today for follow-up of left fifth metatarsal fracture.  Overall doing well reports no pain in the postop shoe.   Review of Systems   Objective: Vital Signs: Ht 5' 1"  (1.549 m)    Wt 144 lb (65.3 kg)    BMI 27.21 kg/m   Physical Exam  Ortho Exam Left foot shows minimal tenderness at the base of fifth metatarsal.  No swelling. Specialty Comments:  No specialty comments available.  Imaging: XR Foot Complete Left  Result Date: 11/19/2019 Stable alignment of fifth metatarsal base fracture.  No fracture or resorption.    PMFS History: Patient Active Problem List   Diagnosis Date Noted   Hypothyroidism 11/08/2019   NASH (nonalcoholic steatohepatitis) 11/08/2019   Hyperlipidemia associated with type 2 diabetes mellitus ([place]) 11/08/2019   Coronary artery disease involving native heart without  angina pectoris 11/08/2019   Essential hypertension 11/08/2019   Bipolar depression ([place]) 11/08/2019   Seasonal allergies 11/08/2019   Idiopathic urticaria 11/08/2019   Nondisplaced fracture of fifth metatarsal bone, left foot, initial encounter for closed fracture 10/22/2019   OSA (obstructive sleep apnea) 09/18/2019   Insomnia 09/18/2019   Past Medical History:  Diagnosis Date   Anxiety    CAD (coronary artery disease)    Depression    Heart attack ([place])    High cholesterol    Hypertension    NASH (nonalcoholic steatohepatitis)    Peripheral neuropathy    Plantar fasciitis    Sciatica    Thyroid disease     Family History  Problem Relation Age of Onset   Lung cancer Mother    COPD Mother    Suicidality Father        ? bipolar disorder   Depression Father    Cancer Daughter     Past Surgical History:  Procedure Laterality Date   APPENDECTOMY     DILATION AND CURETTAGE OF UTERUS     idiopathic urticaria     LEG SURGERY Right 2020   NASH     osteoporosis     TONSILLECTOMY     Social History   Occupational History   Not on file  Tobacco Use   Smoking status: Never Smoker  Smokeless tobacco: Never Used  Vaping Use   Vaping Use: Never used  Substance and Sexual Activity   Alcohol use: Yes   Drug use: Never   Sexual activity: Not Currently

## 2019-12-02 ENCOUNTER — Encounter: Payer: Self-pay | Admitting: Family

## 2019-12-02 DIAGNOSIS — M5430 Sciatica, unspecified side: Secondary | ICD-10-CM | POA: Insufficient documentation

## 2019-12-02 DIAGNOSIS — F32A Depression, unspecified: Secondary | ICD-10-CM | POA: Insufficient documentation

## 2019-12-02 DIAGNOSIS — E079 Disorder of thyroid, unspecified: Secondary | ICD-10-CM | POA: Insufficient documentation

## 2019-12-02 DIAGNOSIS — F319 Bipolar disorder, unspecified: Secondary | ICD-10-CM | POA: Insufficient documentation

## 2019-12-02 DIAGNOSIS — F419 Anxiety disorder, unspecified: Secondary | ICD-10-CM | POA: Insufficient documentation

## 2019-12-02 DIAGNOSIS — G629 Polyneuropathy, unspecified: Secondary | ICD-10-CM | POA: Insufficient documentation

## 2019-12-02 DIAGNOSIS — M810 Age-related osteoporosis without current pathological fracture: Secondary | ICD-10-CM | POA: Insufficient documentation

## 2019-12-02 DIAGNOSIS — J45909 Unspecified asthma, uncomplicated: Secondary | ICD-10-CM | POA: Insufficient documentation

## 2019-12-02 DIAGNOSIS — I2510 Atherosclerotic heart disease of native coronary artery without angina pectoris: Secondary | ICD-10-CM | POA: Insufficient documentation

## 2019-12-02 DIAGNOSIS — I10 Essential (primary) hypertension: Secondary | ICD-10-CM | POA: Insufficient documentation

## 2019-12-12 NOTE — Telephone Encounter (Signed)
Sorry this narcotic refill will need to go to Dr.Olarere

## 2019-12-12 NOTE — Telephone Encounter (Signed)
Please refill Klonopin. See notes below.

## 2019-12-12 NOTE — Telephone Encounter (Signed)
Patient of Dr. Ander Slade, requesting refill for Klonopin, last OV 09/18/2019, last refill 11/13/2019. Please refill/advise.

## 2019-12-13 ENCOUNTER — Ambulatory Visit (INDEPENDENT_AMBULATORY_CARE_PROVIDER_SITE_OTHER): Payer: Medicare Other | Admitting: Orthopaedic Surgery

## 2019-12-13 ENCOUNTER — Encounter: Payer: Self-pay | Admitting: Orthopaedic Surgery

## 2019-12-13 ENCOUNTER — Ambulatory Visit (INDEPENDENT_AMBULATORY_CARE_PROVIDER_SITE_OTHER): Payer: Medicare Other

## 2019-12-13 ENCOUNTER — Other Ambulatory Visit: Payer: Self-pay

## 2019-12-13 VITALS — Ht 61.0 in | Wt 139.0 lb

## 2019-12-13 DIAGNOSIS — I2510 Atherosclerotic heart disease of native coronary artery without angina pectoris: Secondary | ICD-10-CM | POA: Diagnosis not present

## 2019-12-13 DIAGNOSIS — S92355A Nondisplaced fracture of fifth metatarsal bone, left foot, initial encounter for closed fracture: Secondary | ICD-10-CM | POA: Diagnosis not present

## 2019-12-13 NOTE — Progress Notes (Signed)
Office Visit Note   Patient: Rebecca Lutz           Date of Birth: 1950/06/15           MRN: [id number] Visit Date: 12/13/2019              Requested by: Debbrah Alar, NP [address] [place],  [place] [address] PCP: Debbrah Alar, NP   Assessment & Plan: Visit Diagnoses:  1. Nondisplaced fracture of fifth metatarsal bone, left foot, initial encounter for closed fracture     Plan: Impression is healed left foot fifth metatarsal base fracture.  Patient will continue to advance with activity as tolerated.  She will follow up with [place] as needed.  Follow-Up Instructions: Return if symptoms worsen or fail to improve.   Orders:  Orders Placed This Encounter  Procedures  . XR Foot Complete Left   No orders of the defined types were placed in this encounter.     Procedures: No procedures performed   Clinical Data: No additional findings.   Subjective: Chief Complaint  Patient presents with  . Left Foot - Follow-up    HPI patient is a pleasant 69 year old female who presents to our clinic today approximately 8 weeks out left foot nondisplaced fifth metatarsal fracture date of injury 10/16/2019.  She has been doing well.  She has an occasional twinge but nothing more.  She has been wearing regular shoes and doing regular activities to include yard work without any issues.     Objective: Vital Signs: Ht 5' 1"  (1.549 m)   Wt 139 lb (63 kg)   BMI 26.26 kg/m     Ortho Exam examination of left foot reveals no swelling.  She has very minimal tenderness to the fracture site.  Full range of motion.  She is neurovascular intact distally.  Specialty Comments:  No specialty comments available.  Imaging: XR Foot Complete Left  Result Date: 12/13/2019 Healed fifth metatarsal base fracture    PMFS History: Patient Active Problem List   Diagnosis Date Noted  . Anxiety   . Asthma   . Bipolar disorder ([place])   . CAD (coronary artery disease)    . Depression   . Hypertension   . Osteoporosis   . Peripheral neuropathy   . Thyroid disease   . Sciatica   . Hypothyroidism 11/08/2019  . NASH (nonalcoholic steatohepatitis) 11/08/2019  . Hyperlipidemia associated with type 2 diabetes mellitus ([place]) 11/08/2019  . Coronary artery disease involving native heart without angina pectoris 11/08/2019  . Essential hypertension 11/08/2019  . Bipolar depression ([place]) 11/08/2019  . Seasonal allergies 11/08/2019  . Idiopathic urticaria 11/08/2019  . Nondisplaced fracture of fifth metatarsal bone, left foot, initial encounter for closed fracture 10/22/2019  . OSA (obstructive sleep apnea) 09/18/2019  . Insomnia 09/18/2019   Past Medical History:  Diagnosis Date  . Anxiety   . Asthma   . Bipolar disorder ([place])   . CAD (coronary artery disease)    MI 2005  . Depression   . Heart attack ([place])   . High cholesterol   . Hypertension   . Idiopathic urticaria   . NASH (nonalcoholic steatohepatitis)   . Osteoporosis    previosly treated with bonivas  . Peripheral neuropathy   . Plantar fasciitis   . Sciatica   . Thyroid disease     Family History  Problem Relation Age of Onset  . Lung cancer Mother   . COPD Mother   .  Suicidality Father        ? bipolar disorder  . Depression Father   . Cancer Daughter     Past Surgical History:  Procedure Laterality Date  . APPENDECTOMY    . DILATION AND CURETTAGE OF UTERUS    . idiopathic urticaria    . LEG SURGERY Right 2020  . NASH    . osteoporosis    . TONSILLECTOMY     Social History   Occupational History  . Not on file  Tobacco Use  . Smoking status: Never Smoker  . Smokeless tobacco: Never Used  Vaping Use  . Vaping Use: Never used  Substance and Sexual Activity  . Alcohol use: Yes  . Drug use: Never  . Sexual activity: Not Currently

## 2019-12-23 ENCOUNTER — Other Ambulatory Visit: Payer: Self-pay

## 2019-12-23 ENCOUNTER — Other Ambulatory Visit (INDEPENDENT_AMBULATORY_CARE_PROVIDER_SITE_OTHER): Payer: Medicare Other

## 2019-12-23 DIAGNOSIS — E039 Hypothyroidism, unspecified: Secondary | ICD-10-CM | POA: Diagnosis not present

## 2019-12-23 LAB — TSH: TSH: 0.14 u[IU]/mL — ABNORMAL LOW (ref 0.35–4.50)

## 2019-12-24 ENCOUNTER — Encounter: Payer: Self-pay | Admitting: Family

## 2019-12-24 ENCOUNTER — Telehealth: Payer: Self-pay | Admitting: Family

## 2019-12-24 DIAGNOSIS — E039 Hypothyroidism, unspecified: Secondary | ICD-10-CM

## 2019-12-24 MED ORDER — LEVOTHYROXINE SODIUM 125 MCG PO TABS
125.0000 ug | ORAL_TABLET | Freq: Every day | ORAL | 0 refills | Status: DC
Start: 2019-12-24 — End: 2020-08-10

## 2019-12-24 NOTE — Telephone Encounter (Signed)
Please advise pt that lab work shows that her synthroid dose is too strong. I would like her to decrease synthroid to 125 mcg once daily.  Repeat TSH in 6 weeks.

## 2019-12-26 NOTE — Addendum Note (Signed)
Addended by: Jiles Prows on: 12/26/2019 03:50 PM   Modules accepted: Orders

## 2019-12-26 NOTE — Telephone Encounter (Signed)
Patient advised of results and to change medication. She was scheduled for tsh in 6 weeks

## 2019-12-26 NOTE — Telephone Encounter (Signed)
Left message for patient to call about her lab results

## 2020-02-03 DIAGNOSIS — G4700 Insomnia, unspecified: Secondary | ICD-10-CM

## 2020-02-03 NOTE — Telephone Encounter (Signed)
Dr. Ander Slade patient is requesting a refill on Clonazepam. If you are ok with refilling this please sign order pended below.

## 2020-02-04 MED ORDER — CLONAZEPAM 0.5 MG PO TABS: ORAL_TABLET | ORAL | 1 refills | Status: DC

## 2020-02-06 NOTE — Addendum Note (Signed)
Addended by: Kelle Darting A on: 02/06/2020 09:54 AM   Modules accepted: Orders

## 2020-02-10 ENCOUNTER — Other Ambulatory Visit: Payer: Medicare Other

## 2020-02-10 ENCOUNTER — Other Ambulatory Visit: Payer: Self-pay

## 2020-02-10 DIAGNOSIS — E039 Hypothyroidism, unspecified: Secondary | ICD-10-CM

## 2020-02-11 LAB — TSH: TSH: 2.48 mIU/L (ref 0.40–4.50)

## 2020-02-18 ENCOUNTER — Encounter: Payer: Self-pay | Admitting: Family

## 2020-03-04 ENCOUNTER — Encounter: Payer: Self-pay | Admitting: Family

## 2020-03-20 ENCOUNTER — Other Ambulatory Visit: Payer: Self-pay | Admitting: Pulmonary Disease

## 2020-03-20 ENCOUNTER — Encounter: Payer: Self-pay | Admitting: Family

## 2020-03-20 DIAGNOSIS — G4700 Insomnia, unspecified: Secondary | ICD-10-CM

## 2020-04-16 ENCOUNTER — Other Ambulatory Visit: Payer: Self-pay

## 2020-04-16 ENCOUNTER — Encounter: Payer: Self-pay | Admitting: Pulmonary Disease

## 2020-04-16 ENCOUNTER — Ambulatory Visit (INDEPENDENT_AMBULATORY_CARE_PROVIDER_SITE_OTHER): Payer: Medicare Other | Admitting: Pulmonary Disease

## 2020-04-16 VITALS — BP 112/68 | HR 77 | Temp 97.4°F | Ht 61.0 in | Wt 156.8 lb

## 2020-04-16 DIAGNOSIS — G4700 Insomnia, unspecified: Secondary | ICD-10-CM | POA: Diagnosis not present

## 2020-04-16 DIAGNOSIS — G4733 Obstructive sleep apnea (adult) (pediatric): Secondary | ICD-10-CM

## 2020-04-16 MED ORDER — CLONAZEPAM 0.5 MG PO TABS: ORAL_TABLET | ORAL | 3 refills | Status: DC

## 2020-04-16 NOTE — Progress Notes (Signed)
@Patient  ID: Rebecca Lutz, female    DOB: 30-Mar-1951, 69 y.o.   MRN: [id number]  Obstructive sleep apnea Tolerating CPAP well Referring provider: Debbrah Alar, NP  HPI:  Evan-year-old female never smoker followed in our office for obstructive sleep apnea She continues to do well with no significant complaints today  Continues to require Klonopin for sleep No significant issues with CPAP tolerance She did develop a rash around the angles of her mouth, was able to successfully treat this staying off CPAP about a week, as soon as she restarted using CPAP on a nightly basis, the rash came back -May be developing some sensitivity to the mask  PMH: Insomnia  Smoker/ Smoking History: Never Smoker Maintenance:  None  Pt of: Dr. Ander Slade  Last visit in May: Patient presenting to office today reporting that she is doing well.  She reports compliance with using her CPAP.  She reports no issues with it.  She does continue to have dry mouth.  She tried the chinstrap as suggested at last office visit.  She felt that it worsened claustrophobia.  She does not use chinstrap and she continues to have dry mouth which she says she will live with.  She continues to use Klonopin at night about 4-5 times a week.  She is requesting a refill of this today.  She has been maintained on this for some time.  Questionaires / Pulmonary Flowsheets:   MMRC: mMRC Dyspnea Scale mMRC Score  09/18/2019 0    Epworth:  Results of the Epworth flowsheet 07/15/2019  Sitting and reading 2  Watching TV 1  Sitting, inactive in a public place (e.g. a theatre or a meeting) 0  As a passenger in a car for an hour without a break 0  Lying down to rest in the afternoon when circumstances permit 3  Sitting and talking to someone 0  Sitting quietly after a lunch without alcohol 2  In a car, while stopped for a few minutes in traffic 0  Total score 8    Tests:   FENO:  No results found for:  NITRICOXIDE  PFT: No flowsheet data found.  WALK:  No flowsheet data found.  Imaging: No results found.  Lab Results:  CBC No results found for: WBC, RBC, HGB, HCT, PLT, MCV, MCH, MCHC, RDW, LYMPHSABS, MONOABS, EOSABS, BASOSABS  BMET    Component Value Date/Time   NA 140 11/08/2019 1008   K 4.7 11/08/2019 1008   CL 103 11/08/2019 1008   CO2 32 11/08/2019 1008   GLUCOSE 98 11/08/2019 1008   BUN 20 11/08/2019 1008   CREATININE 0.65 11/08/2019 1008   CALCIUM 10.0 11/08/2019 1008    BNP No results found for: BNP  ProBNP No results found for: PROBNP  Specialty Problems      Pulmonary Problems   OSA (obstructive sleep apnea)   Asthma      Allergies  Allergen Reactions  . Penicillins     Immunization History  Administered Date(s) Administered  . Fluad Quad(high Dose 65+) 02/20/2019  . Hepatitis A 02/21/2013, 08/22/2013  . Hepatitis B 02/21/2013, 03/21/2013, 06/13/2013  . Influenza-Unspecified 02/18/2020  . PFIZER SARS-COV-2 Vaccination 06/23/2019, 07/16/2019, 03/04/2020  . Pneumococcal Conjugate-13 01/14/2014  . Zoster Recombinat (Shingrix) 04/01/2017    Past Medical History:  Diagnosis Date  . Anxiety   . Asthma   . Bipolar disorder ([place])   . CAD (coronary artery disease)    MI 2005  . Depression   . Heart  attack ([facility] [facility])   . High cholesterol   . Hypertension   . Idiopathic urticaria   . NASH (nonalcoholic steatohepatitis)   . Osteoporosis    previosly treated with bonivas  . Peripheral neuropathy   . Plantar fasciitis   . Sciatica   . Thyroid disease     Tobacco History: Social History   Tobacco Use  Smoking Status Never Smoker  Smokeless Tobacco Never Used   Counseling given: Not Answered   Continue to not smoke  Outpatient Encounter Medications as of 04/16/2020  Medication Sig  . aspirin EC 81 MG tablet Take 81 mg by mouth daily.  Marland Kitchen atorvastatin (LIPITOR) 80 MG tablet Take 80 mg by mouth daily.  . Cholecalciferol (VITAMIN D3) 25  MCG (1000 UT) CAPS Take by mouth.  . clonazePAM (KLONOPIN) 0.5 MG tablet TAKE HALF TO ONE TABLET BY MOUTH TWICE A DAY AS NEEDED  . ezetimibe (ZETIA) 10 MG tablet Take 10 mg by mouth daily.  Marland Kitchen levothyroxine (SYNTHROID) 125 MCG tablet Take 1 tablet (125 mcg total) by mouth daily.  . metoprolol succinate (TOPROL-XL) 25 MG 24 hr tablet Take 25 mg by mouth daily.  . Misc Natural Products (LUTEIN VISION BLEND PO) Take by mouth daily.  . sertraline (ZOLOFT) 100 MG tablet Take 100 mg by mouth daily.   No facility-administered encounter medications on file as of 04/16/2020.     Review of Systems  Review of Systems  Constitutional: Negative for activity change, fatigue and fever.  HENT: Negative for sinus pressure, sinus pain and sore throat.   Respiratory: Negative for cough, shortness of breath and wheezing.   Cardiovascular: Negative for chest pain and palpitations.  Gastrointestinal: Negative for diarrhea, nausea and vomiting.  Musculoskeletal: Negative for arthralgias.  Neurological: Negative for dizziness.  Psychiatric/Behavioral: Negative for sleep disturbance. The patient is not nervous/anxious.      Physical Exam  BP 112/68 (BP Location: Right Arm, Cuff Size: Normal)   Pulse 77   Temp (!) 97.4 F (36.3 C)   Ht 5' 1"  (1.549 m)   Wt 156 lb 12.8 oz (71.1 kg)   SpO2 99%   BMI 29.63 kg/m   Wt Readings from Last 5 Encounters:  04/16/20 156 lb 12.8 oz (71.1 kg)  12/13/19 139 lb (63 kg)  11/19/19 144 lb (65.3 kg)  11/08/19 144 lb 6.4 oz (65.5 kg)  10/16/19 139 lb (63 kg)    BMI Readings from Last 5 Encounters:  04/16/20 29.63 kg/m  12/13/19 26.26 kg/m  11/19/19 27.21 kg/m  11/08/19 27.28 kg/m  10/16/19 26.26 kg/m     Physical Exam Vitals and nursing note reviewed.  Constitutional:      General: She is not in acute distress.    Appearance: Normal appearance. She is normal weight.  HENT:     Mouth/Throat:     Mouth: Mucous membranes are moist.  Eyes:      General:        Right eye: No discharge.        Left eye: No discharge.  Cardiovascular:     Rate and Rhythm: Normal rate and regular rhythm.     Pulses: Normal pulses.     Heart sounds: Normal heart sounds. No murmur heard.   Pulmonary:     Effort: Pulmonary effort is normal. No respiratory distress.     Breath sounds: Normal breath sounds. No stridor or decreased air movement. No decreased breath sounds, wheezing, rhonchi or rales.  Musculoskeletal:     Cervical  back: No rigidity or tenderness.  Neurological:     Mental Status: She is alert.  Psychiatric:        Mood and Affect: Mood normal.    Assessment & Plan:   Obstructive sleep apnea -Continues to be very compliant with CPAP use -Continues to benefit from CPAP use -Compliance data not available today however, she is very compliant with a CPAP  Insomnia -Klonopin is helping with no significant side effect at present -Control adequate number of hours of sleep  History of asthma History of nonalcoholic steatohepatitis  Hypothyroidism  Plan Continue Klonopin -Prescription sent to pharmacy  Graded exercise as tolerated  Encouraged to continue CPAP nightly  Prescription will be sent to DME for CPAP supplies Lynnmarie Lovett Clearence Ped, MD 04/16/2020   This appointment required 24 minutes of patient care (this includes precharting, chart review, review of results, face-to-face care, etc.).

## 2020-04-16 NOTE — Addendum Note (Signed)
Addended by: Gavin Potters R on: 04/16/2020 09:50 AM   Modules accepted: Orders

## 2020-04-16 NOTE — Patient Instructions (Signed)
Continue Klonopin  Continue CPAP on a regular basis  Exercise as tolerated  Follow-up in 6 months  DME-CPAP supplies -Trial with a new mask, sensitivities to current mask

## 2020-04-21 ENCOUNTER — Other Ambulatory Visit: Payer: Self-pay | Admitting: Family

## 2020-04-21 DIAGNOSIS — Z0000 Encounter for general adult medical examination without abnormal findings: Secondary | ICD-10-CM

## 2020-05-07 ENCOUNTER — Other Ambulatory Visit: Payer: Self-pay

## 2020-05-11 ENCOUNTER — Ambulatory Visit: Payer: Medicare Other | Admitting: *Deleted

## 2020-05-11 ENCOUNTER — Encounter: Payer: Self-pay | Admitting: Family

## 2020-05-11 ENCOUNTER — Ambulatory Visit (INDEPENDENT_AMBULATORY_CARE_PROVIDER_SITE_OTHER): Payer: Medicare Other | Admitting: Family

## 2020-05-11 ENCOUNTER — Other Ambulatory Visit: Payer: Self-pay

## 2020-05-11 VITALS — BP 146/70 | HR 67 | Temp 97.4°F | Resp 16 | Wt 156.0 lb

## 2020-05-11 DIAGNOSIS — E039 Hypothyroidism, unspecified: Secondary | ICD-10-CM

## 2020-05-11 DIAGNOSIS — J329 Chronic sinusitis, unspecified: Secondary | ICD-10-CM

## 2020-05-11 DIAGNOSIS — Z23 Encounter for immunization: Secondary | ICD-10-CM

## 2020-05-11 DIAGNOSIS — M810 Age-related osteoporosis without current pathological fracture: Secondary | ICD-10-CM | POA: Diagnosis not present

## 2020-05-11 DIAGNOSIS — K7581 Nonalcoholic steatohepatitis (NASH): Secondary | ICD-10-CM

## 2020-05-11 DIAGNOSIS — E785 Hyperlipidemia, unspecified: Secondary | ICD-10-CM

## 2020-05-11 DIAGNOSIS — Z1159 Encounter for screening for other viral diseases: Secondary | ICD-10-CM

## 2020-05-11 DIAGNOSIS — R0789 Other chest pain: Secondary | ICD-10-CM | POA: Diagnosis not present

## 2020-05-11 MED ORDER — DOXYCYCLINE HYCLATE 100 MG PO TABS: 100.0000 mg | ORAL_TABLET | Freq: Two times a day (BID) | ORAL | 0 refills | Status: DC

## 2020-05-11 MED ORDER — EZETIMIBE 10 MG PO TABS: 10.0000 mg | ORAL_TABLET | Freq: Every day | ORAL | 1 refills | Status: DC

## 2020-05-11 MED ORDER — ATORVASTATIN CALCIUM 80 MG PO TABS: 80.0000 mg | ORAL_TABLET | Freq: Every day | ORAL | 1 refills | Status: DC

## 2020-05-11 MED ORDER — SERTRALINE HCL 100 MG PO TABS: 100.0000 mg | ORAL_TABLET | Freq: Every day | ORAL | 1 refills | Status: DC

## 2020-05-11 NOTE — Progress Notes (Signed)
Subjective:    Patient ID: Rebecca Lutz, female    DOB: [birth year], 70 y.o.   MRN: [id number]  HPI  Patient is a 70 yr old female who presents today for follow up.   She had a negative rapid covid test at [facility] on 12/25. She had cold symptoms that day. She still has congestion in her head, some pain in her sinuses.  Has hoarseness and post nasal drip.   She reports some left upper chest pressure. First noticed 1 month ago.  Initially attributed to anxiety or indigestion.  She had MI back in 2007.  Pain is not exacerbated by activity. Seems worse at night when she is lying down. One time an antacid helped her symptoms but not the other times she tried it.  Hyperlipidemia- maintained on atorvastatin 80 mg and zetia 61m.  Lab Results  Component Value Date   CHOL 151 11/08/2019   HDL 61.60 11/08/2019   LDLCALC 69 11/08/2019   TRIG 103.0 11/08/2019   CHOLHDL 2 11/08/2019   Hypothyroid- maintained on synthroid 1262m.  Lab Results  Component Value Date   TSH 2.48 02/10/2020   HTN- on toprol xl 2555m  BP Readings from Last 3 Encounters:  05/11/20 (!) 146/70  04/16/20 112/68  11/08/19 128/62      Review of Systems See HPI  Past Medical History:  Diagnosis Date  . Anxiety   . Asthma   . Bipolar disorder ([place] . CAD (coronary artery disease)    MI 2005  . Depression   . Heart attack ([place] . High cholesterol   . Hypertension   . Idiopathic urticaria   . NASH (nonalcoholic steatohepatitis)   . Osteoporosis    previosly treated with bonivas  . Peripheral neuropathy   . Plantar fasciitis   . Sciatica   . Thyroid disease      Social History   Socioeconomic History  . Marital status: Widowed    Spouse name: Not on file  . Number of children: Not on file  . Years of education: Not on file  . Highest education level: Not on file  Occupational History  . Not on file  Tobacco Use  . Smoking status: Never Smoker  . Smokeless tobacco: Never Used   Vaping Use  . Vaping Use: Never used  Substance and Sexual Activity  . Alcohol use: Yes  . Drug use: Never  . Sexual activity: Not Currently  Other Topics Concern  . Not on file  Social History Narrative   2 sons, 1 daughter (all in [place] Widowed 2020   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 APPENDECTOMY    . DILATION AND CURETTAGE OF UTERUS    . idiopathic urticaria    . LEG SURGERY Right 2020  . NASH    . osteoporosis    . TONSILLECTOMY      Family History  Problem Relation  Age of Onset  . Lung cancer Mother   . COPD Mother   . Suicidality Father        ? bipolar disorder  . Depression Father   . Cancer Daughter     Allergies  Allergen Reactions  . Penicillins     Current Outpatient Medications on File Prior to Visit  Medication Sig Dispense Refill  . aspirin EC 81 MG tablet Take 81 mg by mouth daily.    Marland Kitchen atorvastatin (LIPITOR) 80 MG tablet Take 80 mg by mouth daily.    . Cholecalciferol (VITAMIN D3) 25 MCG (1000 UT) CAPS Take by mouth.    . clonazePAM (KLONOPIN) 0.5 MG tablet TAKE HALF TO ONE TABLET BY MOUTH TWICE A DAY AS NEEDED 60 tablet 3  . ezetimibe (ZETIA) 10 MG tablet Take 10 mg by mouth daily.    Marland Kitchen levothyroxine (SYNTHROID) 125 MCG tablet Take 1 tablet (125 mcg total) by mouth daily. 90 tablet 0  . metoprolol succinate (TOPROL-XL) 25 MG 24 hr tablet Take 25 mg by mouth daily.    . Misc Natural Products (LUTEIN VISION BLEND PO) Take by mouth daily.     . sertraline (ZOLOFT) 100 MG tablet Take 100 mg by mouth daily.     No current facility-administered medications on file prior to visit.    BP (!) 146/70 (BP Location: Right Arm, Patient Position: Sitting, Cuff Size: Small)   Pulse 67   Temp (!) 97.4 F (36.3 C) (Temporal)   Resp 16   Wt 156 lb (70.8 kg)   SpO2 99%   BMI 29.48 kg/m       Objective:   Physical Exam Constitutional:      Appearance: She is well-developed and well-nourished.  Neck:     Thyroid: No thyromegaly.  Cardiovascular:     Rate and Rhythm: Normal rate and regular rhythm.     Heart sounds: Normal heart sounds. No murmur heard.   Pulmonary:     Effort: Pulmonary effort is normal. No respiratory distress.     Breath sounds: Normal breath sounds. No wheezing.  Musculoskeletal:     Cervical back: Neck supple.  Skin:    General: Skin is warm and dry.  Neurological:     Mental Status: She is alert and oriented to person, place, and time.  Psychiatric:        Mood and Affect: Mood and affect normal.        Behavior: Behavior normal.        Thought Content: Thought content normal.        Judgment: Judgment normal.           Assessment & Plan:  Atypical chest pain- EKG tracing is personally reviewed.  EKG notes NSR.  No acute changes.  Chest pain is reproducible.  Given her history of coronary artery disease and no recent cardiology consultation, will refer to cardiology for further evaluation.  She is advised that should her chest pain worsen she should go to the emergency room.  Sinusitis-patient is allergic to penicillin.  We will plan to treat with doxycycline 100 mg p.o. twice daily x10 days.  Hyperlipidemia-tolerating atorvastatin 80 mg once daily.  Lipids at goal.  Continue same.  Hypothyroid-clinically stable on Synthroid 125 mcg once daily.  Continue same.  Need for hep C testing-hep C antibody testing today.  Osteoporosis-will obtain follow-up bone density.  Depression-stable on  sertraline 100 mg.  Continue same.  She denies any recent manic symptoms.  NASH-we  discussed treatment as a low-fat low-cholesterol diet, exercise and weight loss.  Hypertension-blood pressure is at goal for age.  Continue Toprol-XL 25 mg once daily.  This visit occurred during the SARS-CoV-2 public health emergency.  Safety protocols were in place, including screening questions prior to the visit, additional usage of staff PPE, and extensive cleaning of exam room while observing appropriate contact time as indicated for disinfecting solutions.

## 2020-05-11 NOTE — Patient Instructions (Addendum)
Begin Doxycycline twice daily for 10 days to treat sinus infection. You should be contacted about scheduling your appointment with cardiology. Please go to the ER if you develop worsening chest pain.  Complete lab work prior to leaving.

## 2020-05-12 LAB — HEPATITIS C ANTIBODY
Hepatitis C Ab: NONREACTIVE
SIGNAL TO CUT-OFF: 0 (ref <1.00)

## 2020-05-14 NOTE — Progress Notes (Signed)
Cardiology Office Note:    Date:  05/15/2020   ID:  Rebecca Lutz, Rebecca Lutz [birth year], MRN [id number]  PCP:  Debbrah Alar, NP  Cardiologist:  Shirlee More, MD   Referring MD: Debbrah Alar, NP  ASSESSMENT:    1. Chest pain of uncertain etiology   2. Essential hypertension   3. History of MI (myocardial infarction)    PLAN:    In order of problems listed above:  1. Chest pain is nonanginal in nature appears musculoskeletal some tenderness on exam will place her on a low intensity nonsteroidal Aleve 1 daily check chest x-ray because of COVID-19 in our community and plan cardiac CTA for further evaluation continue therapy including aspirin beta-blocker high intensity statin and I gave her prescription for nitroglycerin if the symptoms are more typical or severe. 2. BP at target continue current treatment beta-blocker 3. Lipids are at target ideal continue high intensity statin  Next appointment she will follow-up in the office in 6 weeks.   Medication Adjustments/Labs and Tests Ordered: Current medicines are reviewed at length with the patient today.  Concerns regarding medicines are outlined above.  Orders Placed This Encounter  Procedures  . CT CORONARY MORPH W/CTA COR W/SCORE W/CA W/CM &/OR WO/CM  . CT CORONARY FRACTIONAL FLOW RESERVE DATA PREP  . DG Chest 2 View  . Basic metabolic panel  . EKG 12-Lead   Meds ordered this encounter  Medications  . nitroGLYCERIN (NITROSTAT) 0.4 MG SL tablet    Sig: Place 1 tablet (0.4 mg total) under the tongue every 5 (five) minutes as needed for chest pain.    Dispense:  90 tablet    Refill:  3     No chief complaint on file.   History of Present Illness:    Rebecca Lutz is a 70 y.o. female who is being seen today for the evaluation of chest pain at the request of Debbrah Alar, NP.  EKG 05/12/2019: SRTH normal  She has a history of myocardial infarction with normal coronary arteriography 2007 [place]..   The event occurred in 2007 unassociated chest pain but with severe emotional stress and when she was told that plaque broke off in her artery causing heart attack however her EKG is normal and showed normal coronary arteriography have requested those records.  She is maintained on appropriate therapy with aspirin beta-blocker and high intensity statin.  In the last month she has developed chest pain that is nonexertional on relieved with rest localized to left sternal border associated with tenderness little worse with a deep breath and positional occurs particularly at night when she is propped up in the bed reading it is not severe it waxes and wanes and lasts minutes to hours.  This is not similar to her previous event.  She has no shortness of breath history of venous thromboembolism no trauma to her chest.  No risk factors for venous thromboembolism she is a non-smoker recent labs July 11/08/2019 showed lipids at target cholesterol 151 LDL 69 triglycerides 103 HDL 61 normal renal function creatinine 0.65 and no dye allergy. Past Medical History:  Diagnosis Date  . Anxiety   . Asthma   . Bipolar disorder ([place])   . CAD (coronary artery disease)    MI 2005  . Depression   . Heart attack ([place])   . High cholesterol   . Hypertension   . Idiopathic urticaria   . NASH (nonalcoholic steatohepatitis)   . Osteoporosis    previosly treated with bonivas  .  Peripheral neuropathy   . Plantar fasciitis   . Sciatica   . Thyroid disease     Past Surgical History:  Procedure Laterality Date  . APPENDECTOMY    . DILATION AND CURETTAGE OF UTERUS    . idiopathic urticaria    . LEG SURGERY Right 2020  . NASH    . osteoporosis    . TONSILLECTOMY      Current Medications: Current Meds  Medication Sig  . Ascorbic Acid (VITAMIN C) 1000 MG tablet Take 1,000 mg by mouth daily.  Marland Kitchen aspirin EC 81 MG tablet Take 81 mg by mouth daily.  Marland Kitchen atorvastatin (LIPITOR) 80 MG tablet Take 1 tablet (80 mg total) by  mouth daily.  . clonazePAM (KLONOPIN) 0.5 MG tablet TAKE HALF TO ONE TABLET BY MOUTH TWICE A DAY AS NEEDED  . doxycycline (VIBRA-TABS) 100 MG tablet Take 1 tablet (100 mg total) by mouth 2 (two) times daily.  Marland Kitchen ezetimibe (ZETIA) 10 MG tablet Take 1 tablet (10 mg total) by mouth daily.  Marland Kitchen levothyroxine (SYNTHROID) 125 MCG tablet Take 1 tablet (125 mcg total) by mouth daily.  . metoprolol succinate (TOPROL-XL) 25 MG 24 hr tablet Take 25 mg by mouth daily.  . Misc Natural Products (LUTEIN VISION BLEND PO) Take by mouth daily.  . nitroGLYCERIN (NITROSTAT) 0.4 MG SL tablet Place 1 tablet (0.4 mg total) under the tongue every 5 (five) minutes as needed for chest pain.  Marland Kitchen sertraline (ZOLOFT) 100 MG tablet Take 1 tablet (100 mg total) by mouth daily.  Marland Kitchen UNABLE TO FIND at bedtime. CPAP     Allergies:   Penicillins   Social History   Socioeconomic History  . Marital status: Widowed    Spouse name: Not on file  . Number of children: Not on file  . Years of education: Not on file  . Highest education level: Not on file  Occupational History  . Not on file  Tobacco Use  . Smoking status: Never Smoker  . Smokeless tobacco: Never Used  Vaping Use  . Vaping Use: Never used  Substance and Sexual Activity  . Alcohol use: Yes  . Drug use: Never  . Sexual activity: Not Currently  Other Topics Concern  . Not on file  Social History Narrative   2 sons, 1 daughter (all in [place])   Widowed 2020   Retired- from [employer]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   She has a dog Engineer, drilling)   Completed some college   Enjoys yard work, spending time with her grandchildren, painting, Publishing copy and needlepoint   Social Determinants of Radio broadcast assistant Strain: Not on Art therapist Insecurity: Not on file  Transportation Needs: Not on file  Physical Activity: Not on file  Stress: Not on file  Social Connections: Not on file     Family History: The patient's family history includes  COPD in her mother; Cancer in her daughter; Depression in her father; Heart attack in her mother; Heart failure in her mother; Lung cancer in her mother; Suicidality in her father.  ROS:   ROS Please see the history of present illness.     All other systems reviewed and are negative.  EKGs/Labs/Other Studies Reviewed:    The following studies were reviewed today:   EKG:  EKG is  ordered today.  The ekg ordered today is personally reviewed and demonstrates sinus rhythm and is normal  Recent Labs: 11/08/2019: ALT 21; BUN 20;  Creatinine, Ser 0.65; Potassium 4.7; Sodium 140 02/10/2020: TSH 2.48  Recent Lipid Panel    Component Value Date/Time   CHOL 151 11/08/2019 1008   TRIG 103.0 11/08/2019 1008   HDL 61.60 11/08/2019 1008   CHOLHDL 2 11/08/2019 1008   VLDL 20.6 11/08/2019 1008   LDLCALC 69 11/08/2019 1008    Physical Exam:    VS:  BP 118/66   Pulse 75   Ht 5' 1"  (1.549 m)   Wt 155 lb (70.3 kg)   SpO2 97%   BMI 29.29 kg/m     Wt Readings from Last 3 Encounters:  05/15/20 155 lb (70.3 kg)  05/11/20 156 lb (70.8 kg)  04/16/20 156 lb 12.8 oz (71.1 kg)     GEN: He appears her age well nourished, well developed in no acute distress HEENT: Normal NECK: No JVD; No carotid bruits LYMPHATICS: No lymphadenopathy CARDIAC: RRR, no murmurs, rubs, gallops RESPIRATORY:  Clear to auscultation without rales, wheezing or rhonchi  ABDOMEN: Soft, non-tender, non-distended MUSCULOSKELETAL:  No edema; No deformity  SKIN: Warm and dry NEUROLOGIC:  Alert and oriented x 3 PSYCHIATRIC:  Normal affect     Signed, Shirlee More, MD  05/15/2020 9:29 AM    [facility]

## 2020-05-15 ENCOUNTER — Ambulatory Visit: Payer: Medicare Other | Admitting: Cardiology

## 2020-05-15 ENCOUNTER — Ambulatory Visit (INDEPENDENT_AMBULATORY_CARE_PROVIDER_SITE_OTHER): Payer: Medicare Other | Admitting: Cardiology

## 2020-05-15 ENCOUNTER — Encounter: Payer: Self-pay | Admitting: Cardiology

## 2020-05-15 ENCOUNTER — Other Ambulatory Visit: Payer: Self-pay

## 2020-05-15 VITALS — BP 118/66 | HR 75 | Ht 61.0 in | Wt 155.0 lb

## 2020-05-15 DIAGNOSIS — I10 Essential (primary) hypertension: Secondary | ICD-10-CM | POA: Diagnosis not present

## 2020-05-15 DIAGNOSIS — R079 Chest pain, unspecified: Secondary | ICD-10-CM

## 2020-05-15 DIAGNOSIS — I252 Old myocardial infarction: Secondary | ICD-10-CM | POA: Diagnosis not present

## 2020-05-15 MED ORDER — NITROGLYCERIN 0.4 MG SL SUBL: 0.4000 mg | SUBLINGUAL_TABLET | SUBLINGUAL | 3 refills | Status: DC | PRN

## 2020-05-15 NOTE — Patient Instructions (Addendum)
Medication Instructions:  Your physician has recommended you make the following change in your medication:  START: Nitroglycerin 0.4 mg take one tablet by mouth every 5 minutes up to three times as needed for chest pain  *If you need a refill on your cardiac medications before your next appointment, please call your pharmacy*   Lab Work: Your physician recommends that you return for lab work in: Within one week of your cardiac CT  BMP  If you have labs (blood work) drawn today and your tests are completely normal, you will receive your results only by: Marland Kitchen MyChart Message (if you have MyChart) OR . A paper copy in the mail If you have any lab test that is abnormal or we need to change your treatment, we will call you to review the results.   Testing/Procedures: Your cardiac CT will be scheduled at the below location:   [facility] [address] [place], [place] [address] [phone]  If scheduled at [facility], please arrive at the [facility] main entrance of [facility] 30 minutes prior to test start time. Proceed to the [facility] Radiology Department (first floor) to check-in and test prep.  Please follow these instructions carefully (unless otherwise directed):   On the Night Before the Test: . Be sure to Drink plenty of water. . Do not consume any caffeinated/decaffeinated beverages or chocolate 12 hours prior to your test. . Do not take any antihistamines 12 hours prior to your test.  On the Day of the Test: . Drink plenty of water. Do not drink any water within one hour of the test. . Do not eat any food 4 hours prior to the test. . You may take your regular medications prior to the test.  . Take metoprolol (Lopressor) two hours prior to test. . FEMALES- please wear underwire-free bra if available       After the Test: . Drink plenty of water. . After receiving IV contrast, you may experience a mild flushed feeling. This is  normal. . On occasion, you may experience a mild rash up to 24 hours after the test. This is not dangerous. If this occurs, you can take Benadryl 25 mg and increase your fluid intake. . If you experience trouble breathing, this can be serious. If it is severe call 911 IMMEDIATELY. If it is mild, please call our office. . If you take any of these medications: Glipizide/Metformin, Avandament, Glucavance, please do not take 48 hours after completing test unless otherwise instructed.   Once we have confirmed authorization from your insurance company, we will call you to set up a date and time for your test. Based on how quickly your insurance processes prior authorizations requests, please allow up to 4 weeks to be contacted for scheduling your Cardiac CT appointment. Be advised that routine Cardiac CT appointments could be scheduled as many as 8 weeks after your provider has ordered it.  For non-scheduling related questions, please contact the cardiac imaging nurse navigator should you have any questions/concerns: Marchia Bond, Cardiac Imaging Nurse Navigator Burley Saver, Interim Cardiac Imaging Nurse [place] [facility] Direct Office Dial: [phone]   For scheduling needs, including cancellations and rescheduling, please call [place], [phone].     Follow-Up: At [facility], you and your health needs are our priority.  As part of our continuing mission to provide you with exceptional heart care, we have created designated Provider Care Teams.  These Care Teams include  your primary Cardiologist (physician) and Advanced Practice Providers (APPs -  Physician Assistants and Nurse Practitioners) who all work together to provide you with the care you need, when you need it.  We recommend signing up for the patient portal called "MyChart".  Sign up information is provided on this After Visit Summary.  MyChart is used to connect with patients for Virtual Visits  (Telemedicine).  Patients are able to view lab/test results, encounter notes, upcoming appointments, etc.  Non-urgent messages can be sent to your provider as well.   To learn more about what you can do with MyChart, go to [email or web address].    Your next appointment:   6 week(s)  The format for your next appointment:   In Person  Provider:   Shirlee More, MD   Other Instructions

## 2020-05-25 ENCOUNTER — Encounter: Payer: Self-pay | Admitting: Family

## 2020-05-27 NOTE — Progress Notes (Signed)
Subjective:   Rebecca Lutz is a 70 y.o. female who presents for Medicare Annual (Subsequent) preventive examination.  Review of Systems     Cardiac Risk Factors include: advanced age (>51mn, >>40women);dyslipidemia;hypertension     Objective:    Today's Vitals   05/28/20 0904  BP: 128/76  Pulse: 64  Resp: 16  Temp: 97.6 F (36.4 C)  TempSrc: Oral  SpO2: 97%  Weight: 157 lb (71.2 kg)  Height: 5' 1"  (1.549 m)   Body mass index is 29.66 kg/m.  Advanced Directives 05/28/2020 10/16/2019  Does Patient Have a Medical Advance Directive? Yes Yes  Type of AParamed[place]of [place] will [place] will  Copy of [place] Chart? No - copy requested -    Current Medications (verified) Outpatient Encounter Medications as of 05/28/2020  Medication Sig  . aspirin EC 81 MG tablet Take 81 mg by mouth daily.  .Marland Kitchenatorvastatin (LIPITOR) 80 MG tablet Take 1 tablet (80 mg total) by mouth daily.  . cholecalciferol (VITAMIN D3) 25 MCG (1000 UNIT) tablet Take 1,000 Units by mouth daily.  . clonazePAM (KLONOPIN) 0.5 MG tablet TAKE HALF TO ONE TABLET BY MOUTH TWICE A DAY AS NEEDED  . ezetimibe (ZETIA) 10 MG tablet Take 1 tablet (10 mg total) by mouth daily.  .Marland Kitchenlevothyroxine (SYNTHROID) 125 MCG tablet Take 1 tablet (125 mcg total) by mouth daily.  . metoprolol succinate (TOPROL-XL) 25 MG 24 hr tablet Take 25 mg by mouth daily.  . Misc Natural Products (LUTEIN VISION BLEND PO) Take by mouth daily.  . nitroGLYCERIN (NITROSTAT) 0.4 MG SL tablet Place 1 tablet (0.4 mg total) under the tongue every 5 (five) minutes as needed for chest pain.  .Marland Kitchensertraline (ZOLOFT) 100 MG tablet Take 1 tablet (100 mg total) by mouth daily.  .Marland KitchenUNABLE TO FIND at bedtime. CPAP  . [DISCONTINUED] Ascorbic Acid (VITAMIN C) 1000 MG tablet Take 1,000 mg by mouth daily.  . [DISCONTINUED] doxycycline (VIBRA-TABS) 100 MG tablet Take 1 tablet (100 mg  total) by mouth 2 (two) times daily.   No facility-administered encounter medications on file as of 05/28/2020.    Allergies (verified) Penicillins   History: Past Medical History:  Diagnosis Date  . Anxiety   . Asthma   . Bipolar disorder ([place]   . CAD (coronary artery disease)    MI 2005  . Depression   . Heart attack ([place]   . High cholesterol   . Hypertension   . Idiopathic urticaria   . NASH (nonalcoholic steatohepatitis)   . Osteoporosis    previosly treated with bonivas  . Peripheral neuropathy   . Plantar fasciitis   . Sciatica   . Thyroid disease    Past Surgical History:  Procedure Laterality Date  . APPENDECTOMY    . DILATION AND CURETTAGE OF UTERUS    . idiopathic urticaria    . LEG SURGERY Right 2020  . NASH    . osteoporosis    . TONSILLECTOMY     Family History  Problem Relation Age of Onset  . Lung cancer Mother   . COPD Mother   . Heart attack Mother   . Heart failure Mother   . Suicidality Father        ? bipolar disorder  . Depression Father   . Cancer Daughter    Social History   Socioeconomic History  . Marital status: Widowed    Spouse name: Not on  file  . Number of children: Not on file  . Years of education: Not on file  . Highest education level: Not on file  Occupational History  . Not on file  Tobacco Use  . Smoking status: Never Smoker  . Smokeless tobacco: Never Used  Vaping Use  . Vaping Use: Never used  Substance and Sexual Activity  . Alcohol use: Yes    Comment: occasional  . Drug use: Never  . Sexual activity: Not Currently  Other Topics Concern  . 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 Difficulty of Paying Living Expenses: Not hard at all  Food Insecurity: No Food Insecurity  . Worried About Charity fundraiser in the Last Year: Never true  . Ran Out of Food in the Last Year: Never true  Transportation Needs: No Transportation Needs  . Lack of Transportation (Medical): No  . Lack of Transportation (Non-Medical): No  Physical Activity: Sufficiently Active  . Days of Exercise per Week: 7 days  . Minutes of Exercise per Session: 30 min  Stress: No Stress Concern Present  . Feeling of Stress : Not at all  Social Connections: Moderately Integrated  . Frequency of Communication with Friends and Family: More than three times a week  . Frequency of Social Gatherings with Friends and Family: More than three times a week  . Attends Religious Services: More than 4 times per year  . Active Member of Clubs or Organizations: Yes  . Attends Archivist Meetings: 1 to 4 times per year  . Marital Status: Widowed    Tobacco Counseling Counseling given: Not Answered   Clinical Intake:  Pre-visit preparation completed: Yes  Pain : No/denies pain     Nutritional Status: BMI 25 -29 Overweight Nutritional Risks: None Diabetes: No  How often do you need to have someone help you when you read instructions, pamphlets, or other written materials from your doctor or pharmacy?: 1 - Never  Diabetes? No  Interpreter Needed?: No  Information entered by :: Caroleen Hamman LPN   Activities of Daily Living In your present state of health, do you have any difficulty performing the following activities: 05/28/2020  Hearing? N  Vision? N  Difficulty concentrating or making decisions? N  Walking or climbing stairs? N  Dressing or bathing? N  Doing errands, shopping? N  Preparing Food and eating ? N  Using the Toilet? N  In the past six months, have you accidently leaked urine? Y  Comment occasionally  Do you have problems with loss of bowel control?  N  Managing your Medications? N  Managing your Finances? N  Housekeeping or managing your Housekeeping? N  Some recent data might be hidden    Patient Care Team: Debbrah Alar, NP as PCP - General (Internal Medicine)  Indicate any recent Medical Services you may have received from other than [facility] providers in the past year (date may be approximate).     Assessment:   This is a routine wellness examination for Rebecca Lutz.  Hearing/Vision screen  Hearing Screening   125Hz  250Hz   500Hz  1000Hz  2000Hz  3000Hz  4000Hz  6000Hz  8000Hz   Right ear:           Left ear:           Comments: No issues  Vision Screening Comments: Wears glasses Last eye exam-[facility]  Dietary issues and exercise activities discussed: Current Exercise Habits: Home exercise routine, Type of exercise: walking, Time (Minutes): 30, Frequency (Times/Week): 7, Weekly Exercise (Minutes/Week): 210, Intensity: Mild, Exercise limited by: None identified  Goals    . Patient Stated     Maintain current health      Depression Screen PHQ 2/9 Scores 05/28/2020  PHQ - 2 Score 1    Fall Risk Fall Risk  05/28/2020  Falls in the past year? 1  Number falls in past yr: 0  Injury with Fall? 1  Risk for fall due to : History of fall(s)  Follow up Falls prevention discussed    [place]:  Any stairs in or around the home? No  Home free of loose throw rugs in walkways, pet beds, electrical cords, etc? Yes  Adequate lighting in your home to reduce risk of falls? Yes   ASSISTIVE DEVICES UTILIZED TO PREVENT FALLS:  Life alert? No  Use of a cane, walker or w/c? No  Grab bars in the bathroom? Yes  Shower chair or bench in shower? Yes  Elevated toilet seat or a handicapped toilet? No   TIMED UP AND GO:  Was the test performed? Yes .  Length of time to ambulate 10 feet: 9 sec.   Gait steady and fast without use of assistive device   Cognitive Function:Normal cognitive  status assessed by direct observation by this Nurse Health Advisor. No abnormalities found.          Immunizations Immunization History  Administered Date(s) Administered  . Fluad Quad(high Dose 65+) 02/20/2019  . Hepatitis A 02/21/2013, 08/22/2013  . Hepatitis B 02/21/2013, 03/21/2013, 06/13/2013  . Influenza-Unspecified 02/18/2020  . PFIZER(Purple Top)SARS-COV-2 Vaccination 06/23/2019, 07/16/2019, 03/04/2020  . Pneumococcal Conjugate-13 01/14/2014  . Pneumococcal Polysaccharide-23 05/11/2020  . Zoster Recombinat (Shingrix) 01/30/2017, 04/01/2017    TDAP status: Due, Education has been provided regarding the importance of this vaccine. Advised may receive this vaccine at local pharmacy or Health Dept. Aware to provide a copy of the vaccination record if obtained from local pharmacy or Health Dept. Verbalized acceptance and understanding.  Flu Vaccine status: Up to date  Pneumococcal vaccine status: Up to date  Covid-19 vaccine status: Completed vaccines  Qualifies for Shingles Vaccine? No   Zostavax completed No   Shingrix Completed?: Yes  Screening Tests Health Maintenance  Topic Date Due  . TETANUS/TDAP  Never done  . DEXA SCAN  Never done  . MAMMOGRAM  04/17/2021  . COLONOSCOPY (Pts 45-28yr Insurance coverage will need to be confirmed)  05/07/2028  . INFLUENZA VACCINE  Completed  . COVID-19 Vaccine  Completed  . Hepatitis C Screening  Completed  . PNA vac Low Risk Adult  Completed  . FOOT EXAM  Discontinued  . HEMOGLOBIN A1C  Discontinued  . OPHTHALMOLOGY EXAM  Discontinued  . URINE MICROALBUMIN  Discontinued    Health Maintenance  Health Maintenance Due  Topic Date Due  . TETANUS/TDAP  Never done  . DEXA SCAN  Never done    Colorectal cancer screening: Type of screening: Colonoscopy. Completed 05/07/2018. Repeat every 10 years  Mammogram status: Scheduled for 06/02/20  Bone Density status: Scheduled for 09/23/20  Lung Cancer  Screening: (Low Dose CT  Chest recommended if Age 65-80 years, 30 pack-year currently smoking OR have quit w/in 15years.) does not qualify.     Additional Screening:  Hepatitis C Screening:Completed 05/11/2020  Vision Screening: Recommended annual ophthalmology exams for early detection of glaucoma and other disorders of the eye. Is the patient up to date with their annual eye exam?  No  Who is the provider or what is the name of the office in which the patient attends annual eye exams? [facility] Patient has an upcoming appt scheduled  Dental Screening: Recommended annual dental exams for proper oral hygiene  Community Resource Referral / Chronic Care Management: CRR required this visit?  No   CCM required this visit?  No      Plan:     I have personally reviewed and noted the following in the patient's chart:   . Medical and social history . Use of alcohol, tobacco or illicit drugs  . Current medications and supplements . Functional ability and status . Nutritional status . Physical activity . Advanced directives . List of other physicians . Hospitalizations, surgeries, and ER visits in previous 12 months . Vitals . Screenings to include cognitive, depression, and falls . Referrals and appointments  In addition, I have reviewed and discussed with patient certain preventive protocols, quality metrics, and best practice recommendations. A written personalized care plan for preventive services as well as general preventive health recommendations were provided to patient.   Patient to access avs on mychart.   Marta Antu, LPN   [id number]/82/[id number]  Nurse Health Advisor  Nurse Notes: None

## 2020-05-28 ENCOUNTER — Ambulatory Visit (INDEPENDENT_AMBULATORY_CARE_PROVIDER_SITE_OTHER): Payer: Medicare Other

## 2020-05-28 ENCOUNTER — Other Ambulatory Visit: Payer: Self-pay

## 2020-05-28 VITALS — BP 128/76 | HR 64 | Temp 97.6°F | Resp 16 | Ht 61.0 in | Wt 157.0 lb

## 2020-05-28 DIAGNOSIS — Z0000 Encounter for general adult medical examination without abnormal findings: Secondary | ICD-10-CM

## 2020-05-28 NOTE — Patient Instructions (Signed)
Rebecca Lutz , Thank you for taking time to come for your Medicare Wellness Visit. I appreciate your ongoing commitment to your health goals. Please review the following plan we discussed and let me know if I can assist you in the future.   Screening recommendations/referrals: Colonoscopy: Completed 05/07/2018- Not required after age 72 Mammogram: Scheduled for 06/02/20 Bone Density: Scheduled for 09/23/2020 Recommended yearly ophthalmology/optometry visit for glaucoma screening and checkup Recommended yearly dental visit for hygiene and checkup  Vaccinations: Influenza vaccine: Up to date Pneumococcal vaccine: Completed vaccines Tdap vaccine: Discuss with pharmacy Shingles vaccine: Completed vaccines Covid-19:Completed vaccines  Advanced directives: Please bring a copy for your chart  Conditions/risks identified: See problem list  Next appointment: Follow up in one year for your annual wellness visit 06/03/2021 @ 9:00   Preventive Care 65 Years and Older, Female Preventive care refers to lifestyle choices and visits with your health care provider that can promote health and wellness. What does preventive care include?  A yearly physical exam. This is also called an annual well check.  Dental exams once or twice a year.  Routine eye exams. Ask your health care provider how often you should have your eyes checked.  Personal lifestyle choices, including:  Daily care of your teeth and gums.  Regular physical activity.  Eating a healthy diet.  Avoiding tobacco and drug use.  Limiting alcohol use.  Practicing safe sex.  Taking low-dose aspirin every day.  Taking vitamin and mineral supplements as recommended by your health care provider. What happens during an annual well check? The services and screenings done by your health care provider during your annual well check will depend on your age, overall health, lifestyle risk factors, and family history of  disease. Counseling  Your health care provider may ask you questions about your:  Alcohol use.  Tobacco use.  Drug use.  Emotional well-being.  Home and relationship well-being.  Sexual activity.  Eating habits.  History of falls.  Memory and ability to understand (cognition).  Work and work Statistician.  Reproductive health. Screening  You may have the following tests or measurements:  Height, weight, and BMI.  Blood pressure.  Lipid and cholesterol levels. These may be checked every 5 years, or more frequently if you are over 71 years old.  Skin check.  Lung cancer screening. You may have this screening every year starting at age 51 if you have a 30-pack-year history of smoking and currently smoke or have quit within the past 15 years.  Fecal occult blood test (FOBT) of the stool. You may have this test every year starting at age 71.  Flexible sigmoidoscopy or colonoscopy. You may have a sigmoidoscopy every 5 years or a colonoscopy every 10 years starting at age 60.  Hepatitis C blood test.  Hepatitis B blood test.  Sexually transmitted disease (STD) testing.  Diabetes screening. This is done by checking your blood sugar (glucose) after you have not eaten for a while (fasting). You may have this done every 1-3 years.  Bone density scan. This is done to screen for osteoporosis. You may have this done starting at age 63.  Mammogram. This may be done every 1-2 years. Talk to your health care provider about how often you should have regular mammograms. Talk with your health care provider about your test results, treatment options, and if necessary, the need for more tests. Vaccines  Your health care provider may recommend certain vaccines, such as:  Influenza vaccine. This is recommended every  year.  Tetanus, diphtheria, and acellular pertussis (Tdap, Td) vaccine. You may need a Td booster every 10 years.  Zoster vaccine. You may need this after age  42.  Pneumococcal 13-valent conjugate (PCV13) vaccine. One dose is recommended after age 54.  Pneumococcal polysaccharide (PPSV23) vaccine. One dose is recommended after age 89. Talk to your health care provider about which screenings and vaccines you need and how often you need them. This information is not intended to replace advice given to you by your health care provider. Make sure you discuss any questions you have with your health care provider. Document Released: 05/22/2015 Document Revised: 01/13/2016 Document Reviewed: 02/24/2015 Elsevier Interactive Patient Education  2017 [employer] Prevention in the Home Falls can cause injuries. They can happen to people of all ages. There are many things you can do to make your home safe and to help prevent falls. What can I do on the outside of my home?  Regularly fix the edges of walkways and driveways and fix any cracks.  Remove anything that might make you trip as you walk through a door, such as a raised step or threshold.  Trim any bushes or trees on the path to your home.  Use bright outdoor lighting.  Clear any walking paths of anything that might make someone trip, such as rocks or tools.  Regularly check to see if handrails are loose or broken. Make sure that both sides of any steps have handrails.  Any raised decks and porches should have guardrails on the edges.  Have any leaves, snow, or ice cleared regularly.  Use sand or salt on walking paths during winter.  Clean up any spills in your garage right away. This includes oil or grease spills. What can I do in the bathroom?  Use night lights.  Install grab bars by the toilet and in the tub and shower. Do not use towel bars as grab bars.  Use non-skid mats or decals in the tub or shower.  If you need to sit down in the shower, use a plastic, non-slip stool.  Keep the floor dry. Clean up any water that spills on the floor as soon as it happens.  Remove  soap buildup in the tub or shower regularly.  Attach bath mats securely with double-sided non-slip rug tape.  Do not have throw rugs and other things on the floor that can make you trip. What can I do in the bedroom?  Use night lights.  Make sure that you have a light by your bed that is easy to reach.  Do not use any sheets or blankets that are too big for your bed. They should not hang down onto the floor.  Have a firm chair that has side arms. You can use this for support while you get dressed.  Do not have throw rugs and other things on the floor that can make you trip. What can I do in the kitchen?  Clean up any spills right away.  Avoid walking on wet floors.  Keep items that you use a lot in easy-to-reach places.  If you need to reach something above you, use a strong step stool that has a grab bar.  Keep electrical cords out of the way.  Do not use floor polish or wax that makes floors slippery. If you must use wax, use non-skid floor wax.  Do not have throw rugs and other things on the floor that can make you trip. What can  I do with my stairs?  Do not leave any items on the stairs.  Make sure that there are handrails on both sides of the stairs and use them. Fix handrails that are broken or loose. Make sure that handrails are as long as the stairways.  Check any carpeting to make sure that it is firmly attached to the stairs. Fix any carpet that is loose or worn.  Avoid having throw rugs at the top or bottom of the stairs. If you do have throw rugs, attach them to the floor with carpet tape.  Make sure that you have a light switch at the top of the stairs and the bottom of the stairs. If you do not have them, ask someone to add them for you. What else can I do to help prevent falls?  Wear shoes that:  Do not have high heels.  Have rubber bottoms.  Are comfortable and fit you well.  Are closed at the toe. Do not wear sandals.  If you use a  stepladder:  Make sure that it is fully opened. Do not climb a closed stepladder.  Make sure that both sides of the stepladder are locked into place.  Ask someone to hold it for you, if possible.  Clearly mark and make sure that you can see:  Any grab bars or handrails.  First and last steps.  Where the edge of each step is.  Use tools that help you move around (mobility aids) if they are needed. These include:  Canes.  Walkers.  Scooters.  Crutches.  Turn on the lights when you go into a dark area. Replace any light bulbs as soon as they burn out.  Set up your furniture so you have a clear path. Avoid moving your furniture around.  If any of your floors are uneven, fix them.  If there are any pets around you, be aware of where they are.  Review your medicines with your doctor. Some medicines can make you feel dizzy. This can increase your chance of falling. Ask your doctor what other things that you can do to help prevent falls. This information is not intended to replace advice given to you by your health care provider. Make sure you discuss any questions you have with your health care provider. Document Released: 02/19/2009 Document Revised: 10/01/2015 Document Reviewed: 05/30/2014 Elsevier Interactive Patient Education  2017 [employer].

## 2020-06-01 ENCOUNTER — Telehealth (HOSPITAL_COMMUNITY): Payer: Self-pay | Admitting: *Deleted

## 2020-06-01 NOTE — Telephone Encounter (Signed)
Pt returning call regarding upcoming cardiac imaging study; pt verbalizes understanding of appt date/time, parking situation and where to check in, pre-test NPO status and medications ordered, and verified current allergies; name and call back number provided for further questions should they arise  Gordy Clement RN Navigator Cardiac [facility] [phone] office [phone] cell

## 2020-06-01 NOTE — Telephone Encounter (Signed)
Attempted to call patient regarding upcoming cardiac CT appointment. Left message on voicemail with name and callback number  Gordy Clement RN Navigator Cardiac [facility] [phone] Office [phone] Cell

## 2020-06-02 ENCOUNTER — Ambulatory Visit
Admission: RE | Admit: 2020-06-02 | Discharge: 2020-06-02 | Disposition: A | Discharge status: Home / Self Care | Payer: Medicare Other | Source: Ambulatory Visit | Attending: Family | Admitting: Family

## 2020-06-02 ENCOUNTER — Other Ambulatory Visit: Payer: Self-pay | Admitting: Family

## 2020-06-02 ENCOUNTER — Other Ambulatory Visit: Payer: Self-pay

## 2020-06-02 DIAGNOSIS — N63 Unspecified lump in unspecified breast: Secondary | ICD-10-CM

## 2020-06-02 DIAGNOSIS — Z0000 Encounter for general adult medical examination without abnormal findings: Secondary | ICD-10-CM

## 2020-06-03 ENCOUNTER — Ambulatory Visit (HOSPITAL_COMMUNITY)
Admission: RE | Admit: 2020-06-03 | Discharge: 2020-06-03 | Disposition: A | Discharge status: Home / Self Care | Payer: Medicare Other | Source: Ambulatory Visit | Attending: Cardiology | Admitting: Cardiology

## 2020-06-03 DIAGNOSIS — R079 Chest pain, unspecified: Secondary | ICD-10-CM | POA: Insufficient documentation

## 2020-06-03 DIAGNOSIS — I252 Old myocardial infarction: Secondary | ICD-10-CM | POA: Diagnosis present

## 2020-06-03 DIAGNOSIS — I700 Atherosclerosis of aorta: Secondary | ICD-10-CM | POA: Diagnosis not present

## 2020-06-03 LAB — POCT I-STAT CREATININE: Creatinine, Ser: 0.6 mg/dL (ref 0.44–1.00)

## 2020-06-03 MED ORDER — NITROGLYCERIN 0.4 MG SL SUBL
SUBLINGUAL_TABLET | SUBLINGUAL | Status: AC
  Administered 2020-06-03: 0.8 mg via SUBLINGUAL
  Filled 2020-06-03: qty 2

## 2020-06-03 MED ORDER — IOHEXOL 350 MG/ML SOLN
80.0000 mL | Freq: Once | INTRAVENOUS | Status: AC | PRN
  Administered 2020-06-03: 80 mL via INTRAVENOUS

## 2020-06-03 MED ORDER — NITROGLYCERIN 0.4 MG SL SUBL: 0.8000 mg | SUBLINGUAL_TABLET | Freq: Once | SUBLINGUAL | Status: AC | PRN

## 2020-06-04 ENCOUNTER — Telehealth: Payer: Self-pay

## 2020-06-04 NOTE — Telephone Encounter (Signed)
Spoke with patient regarding results and recommendation.  Patient verbalizes understanding and is agreeable to plan of care. Advised patient to call back with any issues or concerns.  

## 2020-06-04 NOTE — Telephone Encounter (Signed)
-----   Message from Richardo Priest, MD sent at 06/04/2020 12:31 PM EST ----- Overall, good report  Remain on statin with an elevated calcium score  Very mild plaque in 1 coronary artery I do not think it is causing her chest pain.

## 2020-06-05 ENCOUNTER — Telehealth (HOSPITAL_COMMUNITY): Payer: Self-pay | Admitting: Emergency Medicine

## 2020-06-05 NOTE — Telephone Encounter (Signed)
Pt calling about cardiac CT results.   Marchia Bond RN Navigator Cardiac Imaging [facility] [phone] Office  [phone] Cell

## 2020-06-05 NOTE — Telephone Encounter (Signed)
Left message on patients voicemail to please return our call.   

## 2020-06-11 ENCOUNTER — Ambulatory Visit
Admission: RE | Admit: 2020-06-11 | Discharge: 2020-06-11 | Disposition: A | Discharge status: Home / Self Care | Payer: Medicare Other | Source: Ambulatory Visit | Attending: Family | Admitting: Family

## 2020-06-11 ENCOUNTER — Other Ambulatory Visit: Payer: Self-pay

## 2020-06-11 DIAGNOSIS — N63 Unspecified lump in unspecified breast: Secondary | ICD-10-CM

## 2020-06-17 LAB — HM DIABETES EYE EXAM

## 2020-07-02 DIAGNOSIS — I219 Acute myocardial infarction, unspecified: Secondary | ICD-10-CM | POA: Insufficient documentation

## 2020-07-02 DIAGNOSIS — M722 Plantar fascial fibromatosis: Secondary | ICD-10-CM | POA: Insufficient documentation

## 2020-07-02 DIAGNOSIS — E7800 Pure hypercholesterolemia, unspecified: Secondary | ICD-10-CM | POA: Insufficient documentation

## 2020-07-04 NOTE — Progress Notes (Signed)
Cardiology Office Note:    Date:  07/06/2020   ID:  [name], [name] 04-12-1951, MRN [id number]  PCP:  Debbrah Alar, NP  Cardiologist:  Shirlee More, MD    Referring MD: Debbrah Alar, NP    ASSESSMENT:    1. Coronary artery disease of native artery of native heart with stable angina pectoris ([place])   2. Essential hypertension   3. High cholesterol    PLAN:    In order of problems listed above:  1. Her cardiac CTA confirms CAD mild single-vessel nonflow limiting.  She is on appropriate medical therapy including aspirin beta-blocker combined statin and Zetia not having angina New York Heart Association class I continue current treatment 2. Stable BP at target continue beta-blocker 3. Continue combined statin therapy LDL at target   Next appointment: 1 year   Medication Adjustments/Labs and Tests Ordered: Current medicines are reviewed at length with the patient today.  Concerns regarding medicines are outlined above.  No orders of the defined types were placed in this encounter.  No orders of the defined types were placed in this encounter.  Chief complaint: Follow-up after cardiac CTA  History of Present Illness:    Rebecca Lutz is a 70 y.o. female with a hx of myocardial infarction with normal coronary arteriography [facility] in [place] in 2007, hypertension and hyperlipidemia last seen 05/15/2020 for chest pain felt to be nonanginal in nature. Compliance with diet, lifestyle and medications: Yes  Following her visit she underwent a cardiac CTA reported 06/03/2020 with a calcium score of 64 68th percentile age and sex matched control and mild CAD with 25 to 49% proximal LAD calcified plaque and stenosis. Most recent labs July 11/08/2019 showed lipids at target cholesterol 151 LDL 69 triglycerides 103 HDL 61 normal renal function creatinine 0.65   I sat down reviewed her cardiac CTA report with her were both reassured. Her score  is not high and she is on good therapy with both statin and Zetia and lipids are at target. She is reassured has had no further chest pain has not needed nitroglycerin. No complaints of chest pain shortness of breath exercise intolerance palpitation or syncope. Past Medical History:  Diagnosis Date  . Anxiety   . Asthma   . Bipolar disorder ([place])   . CAD (coronary artery disease)    MI 2005  . Depression   . Heart attack ([place])   . High cholesterol   . Hypertension   . Idiopathic urticaria   . NASH (nonalcoholic steatohepatitis)   . Osteoporosis    previosly treated with bonivas  . Peripheral neuropathy   . Plantar fasciitis   . Sciatica   . Thyroid disease     Past Surgical History:  Procedure Laterality Date  . APPENDECTOMY    . DILATION AND CURETTAGE OF UTERUS    . idiopathic urticaria    . LEG SURGERY Right 2020  . NASH    . osteoporosis    . TONSILLECTOMY      Current Medications: Current Meds  Medication Sig  . aspirin EC 81 MG tablet Take 81 mg by mouth daily.  Marland Kitchen atorvastatin (LIPITOR) 80 MG tablet Take 1 tablet (80 mg total) by mouth daily.  . clonazePAM (KLONOPIN) 0.5 MG tablet TAKE HALF TO ONE TABLET BY MOUTH TWICE A DAY AS NEEDED  . ezetimibe (ZETIA) 10 MG tablet Take 1 tablet (10 mg total) by mouth daily.  Marland Kitchen levothyroxine (SYNTHROID) 125 MCG tablet Take 1  tablet (125 mcg total) by mouth daily.  . metoprolol succinate (TOPROL-XL) 25 MG 24 hr tablet Take 25 mg by mouth daily.  . Misc Natural Products (LUTEIN VISION BLEND PO) Take by mouth daily.  . nitroGLYCERIN (NITROSTAT) 0.4 MG SL tablet Place 1 tablet (0.4 mg total) under the tongue every 5 (five) minutes as needed for chest pain.  Marland Kitchen sertraline (ZOLOFT) 100 MG tablet Take 1 tablet (100 mg total) by mouth daily.  Marland Kitchen UNABLE TO FIND at bedtime. CPAP     Allergies:   Penicillins   Social History   Socioeconomic History  . Marital status: Widowed    Spouse name: Not on file  . Number of children: Not on  file  . Years of education: Not on file  . Highest education level: Not on file  Occupational History  . Not on file  Tobacco Use  . Smoking status: Never Smoker  . Smokeless tobacco: Never Used  Vaping Use  . Vaping Use: Never used  Substance and Sexual Activity  . Alcohol use: Yes    Comment: occasional  . Drug use: Never  . Sexual activity: Not Currently  Other Topics Concern  . Not on file  Social History Narrative   2 sons, 1 daughter (all in [place])   Widowed 2020   Retired- from [employer]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 Difficulty of Paying Living Expenses: Not hard at all  Food Insecurity: No Food Insecurity  . Worried About Charity fundraiser in the Last Year: Never true  . Ran Out of Food in the Last Year: Never true  Transportation Needs: No Transportation Needs  . Lack of Transportation (Medical): No  . Lack of Transportation (Non-Medical): No  Physical Activity: Sufficiently Active  . Days of Exercise per Week: 7 days  . Minutes of Exercise per Session: 30 min  Stress: No Stress Concern Present  . Feeling of Stress : Not at all  Social Connections: Moderately Integrated  . Frequency of Communication with Friends and Family: More than three times a week  . Frequency of Social Gatherings with Friends and Family: More than three times a week  . Attends Religious Services: More than 4 times per year  . Active Member of Clubs or Organizations: Yes  . Attends Archivist Meetings: 1 to 4 times per year  . Marital Status: Widowed     Family History: The patient's family history includes COPD in her mother; Cancer in her daughter; Depression in her father; Heart attack in her mother; Heart failure in her mother; Lung  cancer in her mother; Suicidality in her father. ROS:   Please see the history of present illness.    All other systems reviewed and are negative.  EKGs/Labs/Other Studies Reviewed:    The following studies were reviewed today:   Recent Labs: 11/08/2019: ALT 21; BUN 20; Potassium 4.7; Sodium 140 02/10/2020: TSH 2.48 06/03/2020: Creatinine, Ser 0.60  Recent Lipid Panel    Component Value Date/Time   CHOL 151 11/08/2019 1008   TRIG 103.0 11/08/2019 1008   HDL 61.60 11/08/2019 1008   CHOLHDL 2 11/08/2019 1008   VLDL 20.6 11/08/2019 1008   LDLCALC 69 11/08/2019 1008  Physical Exam:    VS:  BP 124/70   Pulse 68   Ht 5' 1"  (1.549 m)   Wt 159 lb 1.3 oz (72.2 kg)   SpO2 98%   BMI 30.06 kg/m     Wt Readings from Last 3 Encounters:  07/06/20 159 lb 1.3 oz (72.2 kg)  05/28/20 157 lb (71.2 kg)  05/15/20 155 lb (70.3 kg)     GEN:  Well nourished, well developed in no acute distress HEENT: Normal NECK: No JVD; No carotid bruits LYMPHATICS: No lymphadenopathy CARDIAC: RRR, no murmurs, rubs, gallops RESPIRATORY:  Clear to auscultation without rales, wheezing or rhonchi  ABDOMEN: Soft, non-tender, non-distended MUSCULOSKELETAL:  No edema; No deformity  SKIN: Warm and dry NEUROLOGIC:  Alert and oriented x 3 PSYCHIATRIC:  Normal affect    Signed, Shirlee More, MD  07/06/2020 9:49 AM    [place]

## 2020-07-06 ENCOUNTER — Ambulatory Visit (INDEPENDENT_AMBULATORY_CARE_PROVIDER_SITE_OTHER): Payer: Medicare Other | Admitting: Cardiology

## 2020-07-06 ENCOUNTER — Encounter: Payer: Self-pay | Admitting: Cardiology

## 2020-07-06 ENCOUNTER — Other Ambulatory Visit: Payer: Self-pay

## 2020-07-06 VITALS — BP 124/70 | HR 68 | Ht 61.0 in | Wt 159.1 lb

## 2020-07-06 DIAGNOSIS — E7800 Pure hypercholesterolemia, unspecified: Secondary | ICD-10-CM | POA: Diagnosis not present

## 2020-07-06 DIAGNOSIS — I10 Essential (primary) hypertension: Secondary | ICD-10-CM | POA: Diagnosis not present

## 2020-07-06 DIAGNOSIS — I25118 Atherosclerotic heart disease of native coronary artery with other forms of angina pectoris: Secondary | ICD-10-CM

## 2020-07-06 NOTE — Patient Instructions (Signed)
Medication Instructions:  No medication changes. *If you need a refill on your cardiac medications before your next appointment, please call your pharmacy*   Lab Work: None ordered If you have labs (blood work) drawn today and your tests are completely normal, you will receive your results only by: Marland Kitchen MyChart Message (if you have MyChart) OR . A paper copy in the mail If you have any lab test that is abnormal or we need to change your treatment, we will call you to review the results.   Testing/Procedures: None ordered   Follow-Up: At [facility], you and your health needs are our priority.  As part of our continuing mission to provide you with exceptional heart care, we have created designated Provider Care Teams.  These Care Teams include your primary Cardiologist (physician) and Advanced Practice Providers (APPs -  Physician Assistants and Nurse Practitioners) who all work together to provide you with the care you need, when you need it.  We recommend signing up for the patient portal called "MyChart".  Sign up information is provided on this After Visit Summary.  MyChart is used to connect with patients for Virtual Visits (Telemedicine).  Patients are able to view lab/test results, encounter notes, upcoming appointments, etc.  Non-urgent messages can be sent to your provider as well.   To learn more about what you can do with MyChart, go to [email or web address].    Your next appointment:   12 month(s)  The format for your next appointment:   In Person  Provider:   Shirlee More, MD   Other Instructions NA

## 2020-08-10 ENCOUNTER — Ambulatory Visit (INDEPENDENT_AMBULATORY_CARE_PROVIDER_SITE_OTHER): Payer: Medicare Other | Admitting: Family

## 2020-08-10 ENCOUNTER — Encounter: Payer: Self-pay | Admitting: Family

## 2020-08-10 ENCOUNTER — Other Ambulatory Visit: Payer: Self-pay

## 2020-08-10 VITALS — BP 126/60 | HR 71 | Temp 97.9°F | Resp 16 | Ht 61.0 in | Wt 161.0 lb

## 2020-08-10 DIAGNOSIS — I25118 Atherosclerotic heart disease of native coronary artery with other forms of angina pectoris: Secondary | ICD-10-CM

## 2020-08-10 DIAGNOSIS — E7800 Pure hypercholesterolemia, unspecified: Secondary | ICD-10-CM

## 2020-08-10 DIAGNOSIS — I10 Essential (primary) hypertension: Secondary | ICD-10-CM

## 2020-08-10 DIAGNOSIS — E079 Disorder of thyroid, unspecified: Secondary | ICD-10-CM

## 2020-08-10 DIAGNOSIS — G4733 Obstructive sleep apnea (adult) (pediatric): Secondary | ICD-10-CM

## 2020-08-10 DIAGNOSIS — E039 Hypothyroidism, unspecified: Secondary | ICD-10-CM | POA: Diagnosis not present

## 2020-08-10 LAB — LIPID PANEL
Cholesterol: 150 mg/dL (ref 0–200)
HDL: 49.5 mg/dL (ref >39.00)
NonHDL: 100.02
Total CHOL/HDL Ratio: 3
Triglycerides: 262 mg/dL — ABNORMAL HIGH (ref 0.0–149.0)
VLDL: 52.4 mg/dL — ABNORMAL HIGH (ref 0.0–40.0)

## 2020-08-10 LAB — COMPREHENSIVE METABOLIC PANEL
ALT: 26 U/L (ref 0–35)
AST: 19 U/L (ref 0–37)
Albumin: 4.5 g/dL (ref 3.5–5.2)
Alkaline Phosphatase: 81 U/L (ref 39–117)
BUN: 19 mg/dL (ref 6–23)
CO2: 29 mEq/L (ref 19–32)
Calcium: 9.7 mg/dL (ref 8.4–10.5)
Chloride: 104 mEq/L (ref 96–112)
Creatinine, Ser: 0.61 mg/dL (ref 0.40–1.20)
GFR: 89.79 mL/min (ref >60.00)
Glucose, Bld: 85 mg/dL (ref 70–99)
Potassium: 4.5 mEq/L (ref 3.5–5.1)
Sodium: 140 mEq/L (ref 135–145)
Total Bilirubin: 0.7 mg/dL (ref 0.2–1.2)
Total Protein: 6.5 g/dL (ref 6.0–8.3)

## 2020-08-10 LAB — LDL CHOLESTEROL, DIRECT: Direct LDL: 75 mg/dL

## 2020-08-10 LAB — TSH: TSH: 3.03 u[IU]/mL (ref 0.35–4.50)

## 2020-08-10 MED ORDER — LEVOTHYROXINE SODIUM 125 MCG PO TABS: 125.0000 ug | ORAL_TABLET | Freq: Every day | ORAL | 1 refills | Status: DC

## 2020-08-10 MED ORDER — EZETIMIBE 10 MG PO TABS: 10.0000 mg | ORAL_TABLET | Freq: Every day | ORAL | 1 refills | Status: DC

## 2020-08-10 MED ORDER — METOPROLOL SUCCINATE ER 25 MG PO TB24: 25.0000 mg | ORAL_TABLET | Freq: Every day | ORAL | 1 refills | Status: DC

## 2020-08-10 NOTE — Progress Notes (Signed)
Subjective:    Patient ID: Rebecca Lutz, female    DOB: [birth year], 70 y.o.   MRN: [id number]  HPI  Patient is a 70 yr old female who presents today for follow up.  HTN- maintained on toprol xl 57m.  BP Readings from Last 3 Encounters:  08/10/20 126/60  07/06/20 124/70  06/03/20 111/61   Hypothyroid- maintained on synthroid 125 mcg once daily.  Lab Results  Component Value Date   TSH 2.48 02/10/2020   Hyperlipidemia- maintained on atorvastatin 861mand zetia 1056m Lab Results  Component Value Date   CHOL 151 11/08/2019   HDL 61.60 11/08/2019   LDLCALC 69 11/08/2019   TRIG 103.0 11/08/2019   CHOLHDL 2 11/08/2019   OSA- followed by Dr. OlaAnder SladeShe continues her cpap and feels well with it.   Depression- reports mood is up and down due to her daughter's cancer. ( But overall stable.   Review of Systems See HPI  Past Medical History:  Diagnosis Date  . Anxiety   . Asthma   . Bipolar disorder ([place] . CAD (coronary artery disease)    MI 2005  . Depression   . Heart [place] ([place] . High cholesterol   . Hypertension   . Idiopathic urticaria   . NASH (nonalcoholic steatohepatitis)   . Osteoporosis    previosly treated with bonivas  . Peripheral neuropathy   . Plantar fasciitis   . Sciatica   . Thyroid disease      Social History   Socioeconomic History  . Marital status: Widowed    Spouse name: Not on file  . Number of children: Not on file  . Years of education: Not on file  . Highest education level: Not on file  Occupational History  . Not on file  Tobacco Use  . Smoking status: Never Smoker  . Smokeless tobacco: Never Used  Vaping Use  . Vaping Use: Never used  Substance and Sexual Activity  . Alcohol use: Yes    Comment: occasional  . Drug use: Never  . Sexual activity: Not Currently  Other Topics Concern  . 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 Difficulty of Paying Living Expenses: Not hard at all  Food Insecurity: No Food Insecurity  . Worried About RunCharity fundraiser the Last Year: Never true  . Ran Out of Food in the Last Year: Never true  Transportation Needs: No Transportation Needs  . Lack of Transportation (Medical): No  . Lack of Transportation (Non-Medical): No  Physical Activity: Sufficiently Active  . Days of Exercise per Week: 7 days  . Minutes of Exercise per Session: 30 min  Stress: No Stress Concern Present  . Feeling of Stress : Not at all  Social Connections: Moderately Integrated  . Frequency of Communication with Friends and Family: More than three times a week  . Frequency of Social Gatherings with Friends and Family: More than three times a week  . Attends Religious Services: More than 4 times per year  . Active Member of Clubs  or Organizations: Yes  . Attends Archivist Meetings: 1 to 4 times per year  . Marital Status: Widowed  Intimate Partner Violence: Not At Risk  . Fear of Current or Ex-Partner: No  . Emotionally Abused: No  . Physically Abused: No  . Sexually Abused: No    Past Surgical History:  Procedure Laterality Date  . APPENDECTOMY    . DILATION AND CURETTAGE OF UTERUS    . idiopathic urticaria    . LEG SURGERY Right 2020  . NASH    . osteoporosis    . TONSILLECTOMY      Family History  Problem Relation Age of Onset  . Lung cancer Mother   . COPD Mother   . Heart attack Mother   . Heart failure Mother   . Suicidality Father        ? bipolar disorder  . Depression Father   . Cancer Daughter     Allergies  Allergen Reactions  . Penicillins Rash    Current Outpatient Medications on File Prior  to Visit  Medication Sig Dispense Refill  . aspirin EC 81 MG tablet Take 81 mg by mouth daily.    Marland Kitchen atorvastatin (LIPITOR) 80 MG tablet Take 1 tablet (80 mg total) by mouth daily. 90 tablet 1  . clonazePAM (KLONOPIN) 0.5 MG tablet TAKE HALF TO ONE TABLET BY MOUTH TWICE A DAY AS NEEDED 60 tablet 3  . ezetimibe (ZETIA) 10 MG tablet Take 1 tablet (10 mg total) by mouth daily. 90 tablet 1  . levothyroxine (SYNTHROID) 125 MCG tablet Take 1 tablet (125 mcg total) by mouth daily. 90 tablet 0  . metoprolol succinate (TOPROL-XL) 25 MG 24 hr tablet Take 25 mg by mouth daily.    . Misc Natural Products (LUTEIN VISION BLEND PO) Take by mouth daily.    . nitroGLYCERIN (NITROSTAT) 0.4 MG SL tablet Place 1 tablet (0.4 mg total) under the tongue every 5 (five) minutes as needed for chest pain. 90 tablet 3  . sertraline (ZOLOFT) 100 MG tablet Take 1 tablet (100 mg total) by mouth daily. 90 tablet 1  . UNABLE TO FIND at bedtime. CPAP     No current facility-administered medications on file prior to visit.    BP 126/60 (BP Location: Right Arm, Patient Position: Sitting, Cuff Size: Small)   Pulse 71   Temp 97.9 F (36.6 C) (Oral)   Resp 16   Ht 5' 1"  (1.549 m)   Wt 161 lb (73 kg)   SpO2 98%   BMI 30.42 kg/m       Objective:   Physical Exam Constitutional:      Appearance: She is well-developed.  Neck:     Thyroid: No thyroid mass or thyromegaly.  Cardiovascular:     Rate and Rhythm: Normal rate and regular rhythm.     Heart sounds: Normal heart sounds. No murmur heard.   Pulmonary:     Effort: Pulmonary effort is normal. No respiratory distress.     Breath sounds: Normal breath sounds. No wheezing.  Musculoskeletal:     Cervical back: Neck supple.  Lymphadenopathy:     Cervical: No cervical adenopathy.  Skin:    General: Skin is warm and dry.  Neurological:     Mental Status: She is alert and oriented to person, place, and time.  Psychiatric:        Behavior: Behavior normal.         Thought Content: Thought content normal.  Judgment: Judgment normal.           Assessment & Plan:  HTN- bp is stable. Continue toprol xl 40m once daily.  Hyperlipidemia- maintained on atovastatin 871monce daily and zetia 1068mnce daily.  Obtain follow up lipid panel.  Hypothyroid- clinically stable on synthroid 125 mct once daily. Continue same. Obtain follow up tsh.  Depression- having some situational stress with her daughter's health, loss of her husband 2 years ago and adjusting to "city life."  Overall though, she is stable on zoloft 100m73mce daily.   OSA- stable on cpap, defer to sleep specialist.   This visit occurred during the SARS-CoV-2 public health emergency.  Safety protocols were in place, including screening questions prior to the visit, additional usage of staff PPE, and extensive cleaning of exam room while observing appropriate contact time as indicated for disinfecting solutions.

## 2020-08-10 NOTE — Patient Instructions (Signed)
Please complete lab work prior to leaving.   

## 2020-09-02 ENCOUNTER — Other Ambulatory Visit: Payer: Self-pay | Admitting: Family

## 2020-09-21 ENCOUNTER — Encounter: Payer: Self-pay | Admitting: Family

## 2020-09-23 ENCOUNTER — Ambulatory Visit
Admission: RE | Admit: 2020-09-23 | Discharge: 2020-09-23 | Disposition: A | Discharge status: Home / Self Care | Payer: Medicare Other | Source: Ambulatory Visit | Attending: Family | Admitting: Family

## 2020-09-23 ENCOUNTER — Other Ambulatory Visit: Payer: Self-pay

## 2020-09-23 DIAGNOSIS — M810 Age-related osteoporosis without current pathological fracture: Secondary | ICD-10-CM

## 2020-11-10 ENCOUNTER — Other Ambulatory Visit: Payer: Self-pay

## 2020-11-10 ENCOUNTER — Ambulatory Visit (INDEPENDENT_AMBULATORY_CARE_PROVIDER_SITE_OTHER): Payer: Medicare Other | Admitting: Family

## 2020-11-10 ENCOUNTER — Ambulatory Visit
Admission: RE | Admit: 2020-11-10 | Discharge: 2020-11-10 | Disposition: A | Discharge status: Home / Self Care | Payer: Medicare Other | Source: Ambulatory Visit | Attending: Cardiology | Admitting: Cardiology

## 2020-11-10 ENCOUNTER — Telehealth: Payer: Self-pay

## 2020-11-10 ENCOUNTER — Ambulatory Visit: Payer: Medicare Other

## 2020-11-10 VITALS — BP 131/69 | HR 66 | Temp 98.3°F | Resp 16 | Wt 163.0 lb

## 2020-11-10 DIAGNOSIS — E039 Hypothyroidism, unspecified: Secondary | ICD-10-CM

## 2020-11-10 DIAGNOSIS — I25118 Atherosclerotic heart disease of native coronary artery with other forms of angina pectoris: Secondary | ICD-10-CM

## 2020-11-10 DIAGNOSIS — Z23 Encounter for immunization: Secondary | ICD-10-CM

## 2020-11-10 DIAGNOSIS — R079 Chest pain, unspecified: Secondary | ICD-10-CM | POA: Insufficient documentation

## 2020-11-10 DIAGNOSIS — F32A Depression, unspecified: Secondary | ICD-10-CM | POA: Diagnosis not present

## 2020-11-10 DIAGNOSIS — F419 Anxiety disorder, unspecified: Secondary | ICD-10-CM

## 2020-11-10 DIAGNOSIS — E785 Hyperlipidemia, unspecified: Secondary | ICD-10-CM

## 2020-11-10 DIAGNOSIS — F319 Bipolar disorder, unspecified: Secondary | ICD-10-CM

## 2020-11-10 DIAGNOSIS — I10 Essential (primary) hypertension: Secondary | ICD-10-CM

## 2020-11-10 LAB — LIPID PANEL
Cholesterol: 135 mg/dL (ref 0–200)
HDL: 47.5 mg/dL (ref >39.00)
LDL Cholesterol: 53 mg/dL (ref 0–99)
NonHDL: 87.21
Total CHOL/HDL Ratio: 3
Triglycerides: 171 mg/dL — ABNORMAL HIGH (ref 0.0–149.0)
VLDL: 34.2 mg/dL (ref 0.0–40.0)

## 2020-11-10 NOTE — Progress Notes (Signed)
Subjective:     Patient ID: Rebecca Lutz, female    DOB: 1950/12/29, 70 y.o.   MRN: [id number]  Chief Complaint  Patient presents with   Hypertension    Here for follow up   Hypothyroidism   leg cramps    Patient complains of cramps on both legs    Hypertension  Patient is in today for follow up.  Hypothyroid- feels well on synthroid 155mg once daily.  Lab Results  Component Value Date   TSH 3.03 08/10/2020    HTN- maintained on toprol xl 271monce daily.    BP Readings from Last 3 Encounters:  11/10/20 131/69  08/10/20 126/60  07/06/20 124/70   Hyperlipidemia-maintained on atorvastatin 8067mShe notes that she does get some leg cramps.  This occurs 5-10 times a month.   Lab Results  Component Value Date   CHOL 150 08/10/2020   HDL 49.50 08/10/2020   LDLCALC 69 11/08/2019   LDLDIRECT 75.0 08/10/2020   TRIG 262.0 (H) 08/10/2020   CHOLHDL 3 08/10/2020   Anxiety- continues sertraline 100 mg once daily. Overall stable.    Health Maintenance Due  Topic Date Due   COVID-19 Vaccine (4 - Booster for Pfi[place]) 07/05/2020    Past Medical History:  Diagnosis Date   Anxiety    Asthma    Bipolar disorder ([place]CC[place]  CAD (coronary artery disease)    MI 2005   Depression    Heart attack ([place]CC[place]  High cholesterol    Hypertension    Idiopathic urticaria    NASH (nonalcoholic steatohepatitis)    Osteoporosis    previosly treated with bonivas   Peripheral neuropathy    Plantar fasciitis    Sciatica    Thyroid disease     Past Surgical History:  Procedure Laterality Date   APPENDECTOMY     DILATION AND CURETTAGE OF UTERUS     idiopathic urticaria     LEG SURGERY Right 2020   NASH     osteoporosis     TONSILLECTOMY      Family History  Problem Relation Age of Onset   Lung cancer Mother    COPD Mother    Heart attack Mother    Heart failure Mother    Suicidality Father        ? bipolar disorder   Depression Father    Cancer Daughter      Social History   Socioeconomic History   Marital status: Widowed    Spouse name: Not on file   Number of children: Not on file   Years of education: Not on file   Highest education level: Not on file  Occupational History   Not on file  Tobacco Use   Smoking status: Never   Smokeless tobacco: Never  Vaping Use   Vaping Use: Never used  Substance and Sexual Activity   Alcohol use: Yes    Comment: occasional   Drug use: Never   Sexual activity: Not Currently  Other Topics Concern   Not on file  Social History Narrative   2 sons, 1 daughter (all in [place] Widowed 2020   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   She has a dog (MoEngineer, drilling Completed some college   Enjoys yard work, spending time with her grandchildren, painting, colPublishing copyd needlepoint   Social Determinants of HeaRadio broadcast assistantrain: LowUrania  Difficulty of Paying Living Expenses: Not hard at all  Food Insecurity: No Food Insecurity   Worried About [place] in the Last Year: Never true   Ran Out of Food in the Last Year: Never true  Transportation Needs: No Transportation Needs   Lack of Transportation (Medical): No   Lack of Transportation (Non-Medical): No  Physical Activity: Sufficiently Active   Days of Exercise per Week: 7 days   Minutes of Exercise per Session: 30 min  Stress: No Stress Concern Present   Feeling of Stress : Not at all  Social Connections: Moderately Integrated   Frequency of Communication with Friends and Family: More than three times a week   Frequency of Social Gatherings with Friends and Family: More than three times a week   Attends Religious Services: More than 4 times per year   Active Member of [employer] or Organizations: Yes   Attends Archivist Meetings: 1 to 4 times per year   Marital Status: Widowed  Human resources officer Violence: Not At Risk   Fear of Current or Ex-Partner: No   Emotionally Abused:  No   Physically Abused: No   Sexually Abused: No    Outpatient Medications Prior to Visit  Medication Sig Dispense Refill   aspirin EC 81 MG tablet Take 81 mg by mouth daily.     atorvastatin (LIPITOR) 80 MG tablet TAKE ONE TABLET BY MOUTH DAILY 90 tablet 0   clonazePAM (KLONOPIN) 0.5 MG tablet TAKE HALF TO ONE TABLET BY MOUTH TWICE A DAY AS NEEDED 60 tablet 3   ezetimibe (ZETIA) 10 MG tablet TAKE ONE TABLET BY MOUTH DAILY 90 tablet 0   levothyroxine (SYNTHROID) 125 MCG tablet Take 1 tablet (125 mcg total) by mouth daily. 90 tablet 1   metoprolol succinate (TOPROL-XL) 25 MG 24 hr tablet Take 1 tablet (25 mg total) by mouth daily. 90 tablet 1   Misc Natural Products (LUTEIN VISION BLEND PO) Take by mouth daily.     sertraline (ZOLOFT) 100 MG tablet Take 1 tablet (100 mg total) by mouth daily. 90 tablet 1   UNABLE TO FIND at bedtime. CPAP     nitroGLYCERIN (NITROSTAT) 0.4 MG SL tablet Place 1 tablet (0.4 mg total) under the tongue every 5 (five) minutes as needed for chest pain. 90 tablet 3   No facility-administered medications prior to visit.    Allergies  Allergen Reactions   Penicillins Rash    ROS     Objective:    Physical Exam Constitutional:      General: She is not in acute distress.    Appearance: Normal appearance. She is well-developed.  HENT:     Head: Normocephalic and atraumatic.     Right Ear: External ear normal.     Left Ear: External ear normal.  Eyes:     General: No scleral icterus. Neck:     Thyroid: No thyromegaly.  Cardiovascular:     Rate and Rhythm: Normal rate and regular rhythm.     Heart sounds: Normal heart sounds. No murmur heard. Pulmonary:     Effort: Pulmonary effort is normal. No respiratory distress.     Breath sounds: Normal breath sounds. No wheezing.  Musculoskeletal:     Cervical back: Neck supple.  Skin:    General: Skin is warm and dry.  Neurological:     Mental Status: She is alert and oriented to person, place, and time.   Psychiatric:  Mood and Affect: Mood normal.        Behavior: Behavior normal.        Thought Content: Thought content normal.        Judgment: Judgment normal.    BP 131/69 (BP Location: Right Arm, Patient Position: Sitting, Cuff Size: Small)   Pulse 66   Temp 98.3 F (36.8 C) (Oral)   Resp 16   Wt 163 lb (73.9 kg)   SpO2 98%   BMI 30.80 kg/m  Wt Readings from Last 3 Encounters:  11/10/20 163 lb (73.9 kg)  08/10/20 161 lb (73 kg)  07/06/20 159 lb 1.3 oz (72.2 kg)       Assessment & Plan:   Problem List Items Addressed This Visit       Unprioritized   Hypothyroidism - Primary    TSH Normal, feeling well on synthroid 154mg once daily.        Relevant Orders   Lipid panel   Hyperlipidemia    Repeat lipid panel. She will hold atorvastatin for 1-2 weeks and let me know if her leg cramps improve.        Essential hypertension    BP at goal. Continue toprol xl 267m       Depression    Mood stable on sertraline 10032mContinue same.        Bipolar depression (HCC[place]  Clinically stable. Monitor.       Anxiety    Stable on sertraline 100m14montinue same.         I am having Rebecca Lutz" maintain her aspirin EC, Misc Natural Products (LUTEIN VISION BLEND PO), clonazePAM, sertraline, UNABLE TO FIND, nitroGLYCERIN, metoprolol succinate, levothyroxine, ezetimibe, and atorvastatin.  No orders of the defined types were placed in this encounter.

## 2020-11-10 NOTE — Assessment & Plan Note (Signed)
Stable on sertraline 163m. Continue same.

## 2020-11-10 NOTE — Assessment & Plan Note (Signed)
TSH Normal, feeling well on synthroid 138mg once daily.

## 2020-11-10 NOTE — Progress Notes (Signed)
   Covid-19 Vaccination Clinic  Name:  Rebecca Lutz    MRN: [id number] DOB: [name] 07, 1951  11/10/2020  [name] was observed post Covid-19 immunization for 15 minutes without incident. She was provided with Vaccine Information Sheet and instruction to access the V-Safe system.   [name] was instructed to call 911 with any severe reactions post vaccine: Difficulty breathing  Swelling of face and throat  A fast heartbeat  A bad rash all over body  Dizziness and weakness   Immunizations Administered     Name Date Dose VIS Date Route   PFIZER Comrnaty(Gray TOP) Covid-19 Vaccine 11/10/2020  9:23 AM 0.3 mL 04/16/2020 Intramuscular   Manufacturer: [place]   Lot: [id number]   [place]: [phone]

## 2020-11-10 NOTE — Assessment & Plan Note (Signed)
Repeat lipid panel. She will hold atorvastatin for 1-2 weeks and let me know if her leg cramps improve.

## 2020-11-10 NOTE — Telephone Encounter (Signed)
Spoke with patient regarding results and recommendation.  Patient verbalizes understanding and is agreeable to plan of care. Advised patient to call back with any issues or concerns.  

## 2020-11-10 NOTE — Assessment & Plan Note (Signed)
Mood stable on sertraline 176m. Continue same.

## 2020-11-10 NOTE — Patient Instructions (Addendum)
Hold Atorvastatin for 1-2 weeks. Let me know how your leg cramps are off of the medicine.   Please get your covid booster on the first floor.

## 2020-11-10 NOTE — Assessment & Plan Note (Signed)
BP at goal. Continue toprol xl 60m.

## 2020-11-10 NOTE — Assessment & Plan Note (Signed)
Clinically stable. Monitor.

## 2020-11-12 ENCOUNTER — Other Ambulatory Visit: Payer: Self-pay | Admitting: Family

## 2020-11-13 ENCOUNTER — Other Ambulatory Visit (HOSPITAL_BASED_OUTPATIENT_CLINIC_OR_DEPARTMENT_OTHER): Payer: Self-pay

## 2020-11-13 MED ORDER — COVID-19 MRNA VAC-TRIS(PFIZER) 30 MCG/0.3ML IM SUSP
INTRAMUSCULAR | 0 refills | Status: DC
  Filled 2020-11-13: qty 0.3, 1d supply, fill #0

## 2020-11-23 ENCOUNTER — Other Ambulatory Visit: Payer: Self-pay | Admitting: Pulmonary Disease

## 2020-11-23 DIAGNOSIS — G4700 Insomnia, unspecified: Secondary | ICD-10-CM

## 2020-11-25 ENCOUNTER — Encounter: Payer: Self-pay | Admitting: Family

## 2020-11-25 NOTE — Telephone Encounter (Signed)
Dr. Ander Slade,  Please advise on rx request:  Medication name/strength/dose: Clonazepam 0.17m Medication last rx'd: 04/16/2020 Quantity and number of refills last rx'd: #60 with 3 refills Instructions: Take 1/2 to 1 tablet BID prn  Last OV: 04/16/2020 Next OV: 12/03/2020  Dr. OAnder Sladeplease advise on refill request

## 2020-11-26 MED ORDER — ROSUVASTATIN CALCIUM 10 MG PO TABS: 10.0000 mg | ORAL_TABLET | Freq: Every day | ORAL | 0 refills | Status: DC

## 2020-11-26 NOTE — Telephone Encounter (Signed)
Clonazepam refilled

## 2020-12-02 ENCOUNTER — Telehealth: Payer: Self-pay | Admitting: *Deleted

## 2020-12-02 NOTE — Telephone Encounter (Signed)
Called and spoke with patient, verified that she is using her CPAP machine and it is a Pharmacist, community.  She needed a new machine 3-4 years ago and took over her deceased husband's machine.  There is no SD card in the machine per the patient.  She is still using [facility] for her DME. Will attempt to reach [place] regarding download.  Called and spoke with Hue[facility] Romans, advised the last time she got a macihine with them was 2010.  Advised she needed a new machine, so she started using her deceased husband's machine.  Asked if they could reach out to patient to get the serial # for the machine so we can get a download.  I was told they would reach out to her.  Advised that once the serial # is registered in her name all the data on the machine would be attached to her name.  Nothing further needed.

## 2020-12-03 ENCOUNTER — Other Ambulatory Visit: Payer: Self-pay

## 2020-12-03 ENCOUNTER — Ambulatory Visit (INDEPENDENT_AMBULATORY_CARE_PROVIDER_SITE_OTHER): Payer: Medicare Other | Admitting: Pulmonary Disease

## 2020-12-03 ENCOUNTER — Telehealth: Payer: Self-pay | Admitting: Pulmonary Disease

## 2020-12-03 ENCOUNTER — Encounter: Payer: Self-pay | Admitting: Pulmonary Disease

## 2020-12-03 VITALS — BP 122/68 | HR 81 | Temp 98.5°F | Ht 61.0 in | Wt 161.4 lb

## 2020-12-03 DIAGNOSIS — Z9989 Dependence on other enabling machines and devices: Secondary | ICD-10-CM | POA: Diagnosis not present

## 2020-12-03 DIAGNOSIS — G4733 Obstructive sleep apnea (adult) (pediatric): Secondary | ICD-10-CM | POA: Diagnosis not present

## 2020-12-03 NOTE — Patient Instructions (Signed)
Continue CPAP use on a nightly basis  Continue Klonopin  Call with significant concerns  Follow-up in a year

## 2020-12-03 NOTE — Telephone Encounter (Signed)
Patient called and said there has been a big mishap as she was using her husband CPAP machine so [employer] Romans is trying to get that machine synced up to get compliance and patient stated that will take a few days and was wanting to know if Dr. Ander Slade still wanted to see her today at 245pm even though he will not have compliance report. Will route to Dr. Ander Slade for recs.  Dr. Ander Slade please advise on appt. Thanks!

## 2020-12-03 NOTE — Progress Notes (Signed)
@Patient  ID: Rebecca Lutz, female    DOB: [birth year], 70 y.o.   MRN: [id number]  Obstructive sleep apnea Tolerating CPAP well Referring provider: Debbrah Alar, NP  HPI:  70 year old female with obstructive sleep apnea No significant complaints today  Klonopin helps with sleep No issues with CPAP  Feels she rests well, wakes up feeling like  she has had a  good nights near rest on most days  PMH: Insomnia  Smoker/ Smoking History: Never Smoker Maintenance:  None  Pt of: Dr. Ander Slade Questionaires / Pulmonary Flowsheets:   MMRC: mMRC Dyspnea Scale mMRC Score  09/18/2019 0    Epworth:  Results of the Epworth flowsheet 07/15/2019  Sitting and reading 2  Watching TV 1  Sitting, inactive in a public place (e.g. a theatre or a meeting) 0  As a passenger in a car for an hour without a break 0  Lying down to rest in the afternoon when circumstances permit 3  Sitting and talking to someone 0  Sitting quietly after a lunch without alcohol 2  In a car, while stopped for a few minutes in traffic 0  Total score 8    Tests:   FENO:  No results found for: NITRICOXIDE  PFT: No flowsheet data found.  WALK:  No flowsheet data found.  Imaging: DG Chest 2 View  Result Date: 11/10/2020 CLINICAL DATA:  Chest pain/pressure.  Shortness of breath. EXAM: CHEST - 2 VIEW COMPARISON:  Cardiac CT 06/03/2020 FINDINGS: The cardiomediastinal silhouette is within normal limits. The lungs are well inflated. No airspace consolidation, edema, pleural effusion, pneumothorax is identified. No acute osseous abnormality is seen. IMPRESSION: No active cardiopulmonary disease. Electronically Signed   By: Logan Bores M.D.   On: 11/10/2020 14:47    Lab Results:  CBC No results found for: WBC, RBC, HGB, HCT, PLT, MCV, MCH, MCHC, RDW, LYMPHSABS, MONOABS, EOSABS, BASOSABS  BMET    Component Value Date/Time   NA 140 08/10/2020 0846   K 4.5 08/10/2020 0846   CL 104 08/10/2020 0846   CO2 29  08/10/2020 0846   GLUCOSE 85 08/10/2020 0846   BUN 19 08/10/2020 0846   CREATININE 0.61 08/10/2020 0846   CALCIUM 9.7 08/10/2020 0846    BNP No results found for: BNP  ProBNP No results found for: PROBNP  Specialty Problems       Pulmonary Problems   OSA (obstructive sleep apnea)   Asthma    Allergies  Allergen Reactions   Penicillins Rash    Immunization History  Administered Date(s) Administered   Fluad Quad(high Dose 65+) 02/20/2019   Hepatitis A 02/21/2013, 08/22/2013   Hepatitis B 02/21/2013, 03/21/2013, 06/13/2013   Influenza-Unspecified 02/18/2020   PFIZER Comirnaty(Gray Top)Covid-19 Tri-Sucrose Vaccine 11/10/2020   PFIZER(Purple Top)SARS-COV-2 Vaccination 06/23/2019, 07/16/2019, 03/04/2020   Pneumococcal Conjugate-13 01/14/2014   Pneumococcal Polysaccharide-23 05/11/2020   Tdap 09/21/2020   Zoster Recombinat (Shingrix) 01/30/2017, 04/01/2017    Past Medical History:  Diagnosis Date   Anxiety    Asthma    Bipolar disorder ([place])    CAD (coronary artery disease)    MI 2005   Depression    Heart attack ([place])    High cholesterol    Hypertension    Idiopathic urticaria    NASH (nonalcoholic steatohepatitis)    Osteoporosis    previosly treated with bonivas   Peripheral neuropathy    Plantar fasciitis    Sciatica    Thyroid disease     Tobacco History: Social History  Tobacco Use  Smoking Status Never  Smokeless Tobacco Never   Counseling given: Not Answered   Never smoker  Outpatient Encounter Medications as of 12/03/2020  Medication Sig   aspirin EC 81 MG tablet Take 81 mg by mouth daily.   clonazePAM (KLONOPIN) 0.5 MG tablet TAKE HALF TO ONE TABLET BY MOUTH TWICE A DAY AS NEEDED   COVID-19 mRNA Vac-TriS, Pfizer, SUSP injection Inject into the muscle.   ezetimibe (ZETIA) 10 MG tablet TAKE ONE TABLET BY MOUTH DAILY   levothyroxine (SYNTHROID) 125 MCG tablet Take 1 tablet (125 mcg total) by mouth daily.   metoprolol succinate  (TOPROL-XL) 25 MG 24 hr tablet Take 1 tablet (25 mg total) by mouth daily.   Misc Natural Products (LUTEIN VISION BLEND PO) Take by mouth daily.   rosuvastatin (CRESTOR) 10 MG tablet Take 1 tablet (10 mg total) by mouth daily.   sertraline (ZOLOFT) 100 MG tablet Take 1 tablet (100 mg total) by mouth daily.   UNABLE TO FIND at bedtime. CPAP   nitroGLYCERIN (NITROSTAT) 0.4 MG SL tablet Place 1 tablet (0.4 mg total) under the tongue every 5 (five) minutes as needed for chest pain.   [DISCONTINUED] atorvastatin (LIPITOR) 80 MG tablet Take 80 mg by mouth.   No facility-administered encounter medications on file as of 12/03/2020.     Review of Systems  Review of Systems  Constitutional:  Negative for activity change, fatigue and fever.  HENT:  Negative for sinus pressure, sinus pain and sore throat.   Respiratory:  Negative for cough, shortness of breath and wheezing.   Cardiovascular:  Negative for chest pain and palpitations.  Gastrointestinal:  Negative for diarrhea, nausea and vomiting.  Musculoskeletal:  Negative for arthralgias.  Neurological:  Negative for dizziness.  Psychiatric/Behavioral:  Negative for sleep disturbance. The patient is not nervous/anxious.     Physical Exam  BP 122/68 (BP Location: Left Arm, Patient Position: Sitting, Cuff Size: Normal)   Pulse 81   Temp 98.5 F (36.9 C) (Oral)   Ht 5' 1"  (1.549 m)   Wt 161 lb 6.4 oz (73.2 kg)   SpO2 97%   BMI 30.50 kg/m   Wt Readings from Last 5 Encounters:  12/03/20 161 lb 6.4 oz (73.2 kg)  11/10/20 163 lb (73.9 kg)  08/10/20 161 lb (73 kg)  07/06/20 159 lb 1.3 oz (72.2 kg)  05/28/20 157 lb (71.2 kg)    BMI Readings from Last 5 Encounters:  12/03/20 30.50 kg/m  11/10/20 30.80 kg/m  08/10/20 30.42 kg/m  07/06/20 30.06 kg/m  05/28/20 29.66 kg/m     Physical Exam Vitals and nursing note reviewed.  Constitutional:      General: She is not in acute distress.    Appearance: Normal appearance. She is normal  weight.  HENT:     Mouth/Throat:     Mouth: Mucous membranes are moist.  Eyes:     General:        Right eye: No discharge.        Left eye: No discharge.  Cardiovascular:     Rate and Rhythm: Normal rate and regular rhythm.     Pulses: Normal pulses.     Heart sounds: Normal heart sounds. No murmur heard. Pulmonary:     Effort: Pulmonary effort is normal. No respiratory distress.     Breath sounds: Normal breath sounds. No stridor or decreased air movement. No decreased breath sounds, wheezing, rhonchi or rales.  Musculoskeletal:     Cervical back: No  rigidity or tenderness.  Neurological:     Mental Status: She is alert.  Psychiatric:        Mood and Affect: Mood normal.   Assessment & Plan:   Obstructive sleep apnea -Continues to have compliant with CPAP -Compliance data not available  Insomnia -Continue Klonopin  Hypothyroidism History of asthma History of nonalcoholic steatohepatitis  Graded exercise as tolerated  Encouraged to continue CPAP nightly  Call with significant concerns Laurin Coder, MD 12/03/2020

## 2020-12-08 ENCOUNTER — Encounter: Payer: Self-pay | Admitting: Family

## 2020-12-09 ENCOUNTER — Telehealth: Payer: Self-pay | Admitting: Family

## 2020-12-09 NOTE — Telephone Encounter (Signed)
Form put in provider's folder

## 2020-12-09 NOTE — Telephone Encounter (Signed)
Pt dropped off document to be filled out by provider ([facility]- document - 2 pages) Pt would like document to be faxed to Attn: [employer] number [phone]. Document put at front office tray under providers name.

## 2020-12-15 NOTE — Telephone Encounter (Signed)
Pt seen on 7/28 by Dr. Ander Slade. Will close encounter.

## 2021-01-15 ENCOUNTER — Other Ambulatory Visit (HOSPITAL_BASED_OUTPATIENT_CLINIC_OR_DEPARTMENT_OTHER): Payer: Self-pay

## 2021-02-10 ENCOUNTER — Ambulatory Visit (INDEPENDENT_AMBULATORY_CARE_PROVIDER_SITE_OTHER): Payer: Medicare Other | Admitting: Family

## 2021-02-10 ENCOUNTER — Encounter: Payer: Self-pay | Admitting: Family

## 2021-02-10 ENCOUNTER — Other Ambulatory Visit: Payer: Self-pay

## 2021-02-10 ENCOUNTER — Ambulatory Visit: Payer: Medicare Other | Attending: Internal Medicine

## 2021-02-10 VITALS — BP 120/86 | HR 64 | Temp 97.6°F | Resp 18 | Ht 61.0 in | Wt 163.0 lb

## 2021-02-10 DIAGNOSIS — I10 Essential (primary) hypertension: Secondary | ICD-10-CM

## 2021-02-10 DIAGNOSIS — Z23 Encounter for immunization: Secondary | ICD-10-CM

## 2021-02-10 DIAGNOSIS — E039 Hypothyroidism, unspecified: Secondary | ICD-10-CM

## 2021-02-10 DIAGNOSIS — E785 Hyperlipidemia, unspecified: Secondary | ICD-10-CM | POA: Diagnosis not present

## 2021-02-10 DIAGNOSIS — I25118 Atherosclerotic heart disease of native coronary artery with other forms of angina pectoris: Secondary | ICD-10-CM

## 2021-02-10 DIAGNOSIS — R300 Dysuria: Secondary | ICD-10-CM

## 2021-02-10 DIAGNOSIS — F32A Depression, unspecified: Secondary | ICD-10-CM

## 2021-02-10 LAB — LIPID PANEL
Cholesterol: 124 mg/dL (ref 0–200)
HDL: 50.9 mg/dL (ref >39.00)
LDL Cholesterol: 47 mg/dL (ref 0–99)
NonHDL: 73.02
Total CHOL/HDL Ratio: 2
Triglycerides: 129 mg/dL (ref 0.0–149.0)
VLDL: 25.8 mg/dL (ref 0.0–40.0)

## 2021-02-10 LAB — TSH: TSH: 0.39 u[IU]/mL (ref 0.35–5.50)

## 2021-02-10 NOTE — Assessment & Plan Note (Signed)
BP stable. Continue toprol xl 16m.

## 2021-02-10 NOTE — Patient Instructions (Signed)
Please complete lab work prior to leaving.   

## 2021-02-10 NOTE — Assessment & Plan Note (Signed)
Tolerating crestor 79m better than previous statin. Obtain follow up lipid panel.

## 2021-02-10 NOTE — Progress Notes (Signed)
   Covid-19 Vaccination Clinic  Name:  Brittain Smithey    MRN: [id number] DOB: 1950-08-17  02/10/2021  Ms. Doffing was observed post Covid-19 immunization for 15 minutes without incident. She was provided with Vaccine Information Sheet and instruction to access the V-Safe system.   Ms. Perkinson was instructed to call 911 with any severe reactions post vaccine: Difficulty breathing  Swelling of face and throat  A fast heartbeat  A bad rash all over body  Dizziness and weakness

## 2021-02-10 NOTE — Progress Notes (Addendum)
Subjective:    Patient ID: Rebecca Lutz, female    DOB: 01/11/1951, 70 y.o.   MRN: [id number]  Hyperlipidemia   Notes suprapubic pain and dysuria x 3 weeks. Urine has strong odor.  No fever.   Hypothyroid- feels well on synthroid 125 mcg.  Lab Results  Component Value Date   TSH 0.39 02/10/2021   HTN-  BP Readings from Last 3 Encounters:  02/10/21 120/86  12/03/20 122/68  11/10/20 131/69   Maintained on metoprolol 34m.    Hyperlipidemia- notes fewer leg cramps on crestor. She also continues zetia.  Lab Results  Component Value Date   CHOL 124 02/10/2021   HDL 50.90 02/10/2021   LDLCALC 47 02/10/2021   LDLDIRECT 75.0 08/10/2020   TRIG 129.0 02/10/2021   CHOLHDL 2 02/10/2021    Reports that mood is improving. Now seeing a therapist.   Review of Systems See HPI  Past Medical History:  Diagnosis Date   Anxiety    Asthma    Bipolar disorder ([place]    CAD (coronary artery disease)    MI 2005   Depression    Heart attack ([place]    High cholesterol    Hypertension    Idiopathic urticaria    NASH (nonalcoholic steatohepatitis)    Osteoporosis    previosly treated with bonivas   Peripheral neuropathy    Plantar fasciitis    Sciatica    Thyroid disease      Social History   Socioeconomic History   Marital status: Widowed    Spouse name: Not on file   Number of children: Not on file   Years of education: Not on file   Highest education level: Not on file  Occupational History   Not on file  Tobacco Use   Smoking status: Never   Smokeless tobacco: Never  Vaping Use   Vaping Use: Never used  Substance and Sexual Activity   Alcohol use: Yes    Comment: occasional   Drug use: Never   Sexual activity: Not Currently  Other Topics Concern   Not on file  Social History Narrative   2 sons, 1 daughter (all in [place]   Widowed 2020   Retired- from [employer]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   She has a dog (Engineer, drilling   Completed  some college   Enjoys yard work, spending time with her grandchildren, painting, coloring and needlepoint   [place]    Difficulty of Paying Living Expenses: Not hard at all  Food Insecurity: No Food Insecurity   Worried About RCharity fundraiserin the Last Year: Never true   RArboriculturistin the Last Year: Never true  Transportation Needs: No Transportation Needs   Lack of Transportation (Medical): No   Lack of Transportation (Non-Medical): No  Physical Activity: Sufficiently Active   Days of Exercise per Week: 7 days   Minutes of Exercise per Session: 30 min  Stress: No Stress Concern Present   Feeling of Stress : Not at all  Social Connections: Moderately Integrated   Frequency of Communication with Friends and Family: More than three times a week   Frequency of Social Gatherings with Friends and Family: More than three times a week   Attends Religious Services: More than 4 times per year   Active Member of Clubs or Organizations: Yes  Attends Archivist Meetings: 1 to 4 times per year   Marital Status: Widowed  Human resources officer Violence: Not At Risk   Fear of Current or Ex-Partner: No   Emotionally Abused: No   Physically Abused: No   Sexually Abused: No    Past Surgical History:  Procedure Laterality Date   APPENDECTOMY     DILATION AND CURETTAGE OF UTERUS     idiopathic urticaria     LEG SURGERY Right 2020   NASH     osteoporosis     TONSILLECTOMY      Family History  Problem Relation Age of Onset   Lung cancer Mother    COPD Mother    Heart attack Mother    Heart failure Mother    Suicidality Father        ? bipolar disorder   Depression Father    Cancer Daughter     Allergies  Allergen Reactions   Penicillins Rash    Current Outpatient Medications on File Prior to Visit  Medication Sig Dispense Refill   aspirin EC 81 MG tablet  Take 81 mg by mouth daily.     clonazePAM (KLONOPIN) 0.5 MG tablet TAKE HALF TO ONE TABLET BY MOUTH TWICE A DAY AS NEEDED 60 tablet 2   ezetimibe (ZETIA) 10 MG tablet TAKE ONE TABLET BY MOUTH DAILY 90 tablet 0   levothyroxine (SYNTHROID) 125 MCG tablet Take 1 tablet (125 mcg total) by mouth daily. 90 tablet 1   metoprolol succinate (TOPROL-XL) 25 MG 24 hr tablet Take 1 tablet (25 mg total) by mouth daily. 90 tablet 1   Misc Natural Products (LUTEIN VISION BLEND PO) Take by mouth daily.     rosuvastatin (CRESTOR) 10 MG tablet Take 1 tablet (10 mg total) by mouth daily. 90 tablet 0   sertraline (ZOLOFT) 100 MG tablet Take 1 tablet (100 mg total) by mouth daily. 90 tablet 1   UNABLE TO FIND at bedtime. CPAP     COVID-19 mRNA Vac-TriS, Pfizer, SUSP injection Inject into the muscle. (Patient not taking: Reported on 02/10/2021) 0.3 mL 0   nitroGLYCERIN (NITROSTAT) 0.4 MG SL tablet Place 1 tablet (0.4 mg total) under the tongue every 5 (five) minutes as needed for chest pain. 90 tablet 3   No current facility-administered medications on file prior to visit.    BP 120/86 (BP Location: Left Arm, Patient Position: Sitting, Cuff Size: Normal)   Pulse 64   Temp 97.6 F (36.4 C) (Oral)   Resp 18   Ht 5' 1"  (1.549 m)   Wt 163 lb (73.9 kg)   SpO2 97%   BMI 30.80 kg/m       Objective:   Physical Exam Constitutional:      General: She is not in acute distress.    Appearance: Normal appearance. She is well-developed.  HENT:     Head: Normocephalic and atraumatic.     Right Ear: External ear normal.     Left Ear: External ear normal.  Eyes:     General: No scleral icterus. Neck:     Thyroid: No thyromegaly.  Cardiovascular:     Rate and Rhythm: Normal rate and regular rhythm.     Heart sounds: Normal heart sounds. No murmur heard. Pulmonary:     Effort: Pulmonary effort is normal. No respiratory distress.     Breath sounds: Normal breath sounds. No wheezing.  Musculoskeletal:     Cervical  back: Neck supple.  Skin:  General: Skin is warm and dry.  Neurological:     Mental Status: She is alert and oriented to person, place, and time.  Psychiatric:        Mood and Affect: Mood normal.        Behavior: Behavior normal.        Thought Content: Thought content normal.        Judgment: Judgment normal.          Assessment & Plan:

## 2021-02-10 NOTE — Assessment & Plan Note (Signed)
Improved with her new living situation. Continue zoloft 122m.

## 2021-02-11 ENCOUNTER — Encounter: Payer: Self-pay | Admitting: Family

## 2021-02-11 ENCOUNTER — Telehealth: Payer: Self-pay | Admitting: Family

## 2021-02-11 DIAGNOSIS — R300 Dysuria: Secondary | ICD-10-CM | POA: Insufficient documentation

## 2021-02-11 NOTE — Assessment & Plan Note (Signed)
Clinically stable on synthroid, obtain follow up TSH.

## 2021-02-11 NOTE — Assessment & Plan Note (Signed)
New.  Will obtain UA/Culture. See phone note.

## 2021-02-11 NOTE — Telephone Encounter (Signed)
I realized that we did not collect a urine specimen to evaluate for UTI. Is she still having strong urinary odor and bladder pain?  If so, please have her provide specimen for UA and culture.

## 2021-02-11 NOTE — Telephone Encounter (Signed)
Patient reports she still has urine odor from time to time. She is not feeling well after having a covid and flu shot yesterday. She will wait a few days and call back for lab if she thinks needed in a few days.

## 2021-02-19 ENCOUNTER — Other Ambulatory Visit (HOSPITAL_BASED_OUTPATIENT_CLINIC_OR_DEPARTMENT_OTHER): Payer: Self-pay

## 2021-02-19 MED ORDER — COVID-19MRNA BIVAL VACC PFIZER 30 MCG/0.3ML IM SUSP
INTRAMUSCULAR | 0 refills | Status: DC
  Filled 2021-02-19: qty 0.3, 1d supply, fill #0

## 2021-03-04 ENCOUNTER — Encounter: Payer: Self-pay | Admitting: Family

## 2021-03-04 MED ORDER — ROSUVASTATIN CALCIUM 10 MG PO TABS: 10.0000 mg | ORAL_TABLET | Freq: Every day | ORAL | 1 refills | Status: DC

## 2021-03-10 ENCOUNTER — Other Ambulatory Visit: Payer: Self-pay

## 2021-03-10 DIAGNOSIS — E039 Hypothyroidism, unspecified: Secondary | ICD-10-CM

## 2021-03-10 MED ORDER — LEVOTHYROXINE SODIUM 125 MCG PO TABS: 125.0000 ug | ORAL_TABLET | Freq: Every day | ORAL | 1 refills | Status: DC

## 2021-03-15 ENCOUNTER — Encounter: Payer: Self-pay | Admitting: Family

## 2021-03-17 ENCOUNTER — Other Ambulatory Visit: Payer: Self-pay

## 2021-03-17 MED ORDER — EZETIMIBE 10 MG PO TABS: 10.0000 mg | ORAL_TABLET | Freq: Every day | ORAL | 0 refills | Status: DC

## 2021-05-21 ENCOUNTER — Other Ambulatory Visit: Payer: Self-pay | Admitting: Family

## 2021-05-21 DIAGNOSIS — Z1231 Encounter for screening mammogram for malignant neoplasm of breast: Secondary | ICD-10-CM

## 2021-05-27 DIAGNOSIS — F3181 Bipolar II disorder: Secondary | ICD-10-CM | POA: Diagnosis not present

## 2021-05-27 DIAGNOSIS — F411 Generalized anxiety disorder: Secondary | ICD-10-CM | POA: Diagnosis not present

## 2021-06-02 ENCOUNTER — Other Ambulatory Visit: Payer: Self-pay

## 2021-06-02 ENCOUNTER — Encounter: Payer: Self-pay | Admitting: Cardiology

## 2021-06-02 ENCOUNTER — Ambulatory Visit (INDEPENDENT_AMBULATORY_CARE_PROVIDER_SITE_OTHER): Payer: Medicare Other | Admitting: Cardiology

## 2021-06-02 VITALS — BP 144/72 | HR 70 | Ht 61.0 in | Wt 168.0 lb

## 2021-06-02 DIAGNOSIS — E7800 Pure hypercholesterolemia, unspecified: Secondary | ICD-10-CM | POA: Diagnosis not present

## 2021-06-02 DIAGNOSIS — I10 Essential (primary) hypertension: Secondary | ICD-10-CM | POA: Diagnosis not present

## 2021-06-02 DIAGNOSIS — I25118 Atherosclerotic heart disease of native coronary artery with other forms of angina pectoris: Secondary | ICD-10-CM | POA: Diagnosis not present

## 2021-06-02 NOTE — Progress Notes (Signed)
Cardiology Office Note:    Date:  06/02/2021   ID:  Rebecca Lutz [birth year], MRN [id number]  PCP:  Debbrah Alar, NP  Cardiologist:  Shirlee More, MD    Referring MD: Debbrah Alar, NP    ASSESSMENT:    1. Coronary artery disease of native artery of native heart with stable angina pectoris ([place])   2. Essential hypertension   3. High cholesterol    PLAN:    In order of problems listed above:  She continues to do well with her mild CAD she has an interesting life history with myocardial infarction and normal coronary arteriography and subsequently now has ischemia with nonobstructive coronary arteries these individuals do very well with medical therapy continue aspirin lipid-lowering beta-blocker and nitroglycerin if needed. BP at target continue current treatment as well as her lifestyle and activity Continue current lipid-lowering goals and LDL above 25 less than 55   Next appointment: 1 year   Medication Adjustments/Labs and Tests Ordered: Current medicines are reviewed at length with the patient today.  Concerns regarding medicines are outlined above.  No orders of the defined types were placed in this encounter.  No orders of the defined types were placed in this encounter.   Chief Complaint  Patient presents with   Follow-up   Coronary Artery Disease    History of Present Illness:    Rebecca Lutz is a 71 y.o. female with a hx of CAD mild single-vessel nonflow limiting on CTA and myocardial infarction with normal coronary arteriography 2007 [place] and FFR hypertension and hyperlipidemia last seen 07/06/2020.She underwent a cardiac CTA reported 06/03/2020 with a calcium score of 55 68th percentile age and sex matched control and mild CAD with 25 to 49% proximal LAD calcified plaque and stenosis.   Compliance with diet, lifestyle and medications: Yes  She is married transition to workable retirement  community and is pleased with the quality of her life She is quite active physically and socially and is not having angina edema shortness of breath chest pain palpitation or syncope She has not needed nitroglycerin She tolerates her lipid-lowering therapy high intensity statin and Zetia without muscle pain or weakness Her LDL is ideal  Her lipid profile 3 months ago was favorable LDL 47 at target cholesterol 124 triglycerides 129 non-HDL cholesterol 73  Past Medical History:  Diagnosis Date   Anxiety    Asthma    Bipolar disorder ([place])    CAD (coronary artery disease)    MI 2005   Depression    Heart attack ([place])    High cholesterol    Hypertension    Idiopathic urticaria    NASH (nonalcoholic steatohepatitis)    Osteoporosis    previosly treated with bonivas   Peripheral neuropathy    Plantar fasciitis    Sciatica    Thyroid disease     Past Surgical History:  Procedure Laterality Date   APPENDECTOMY     DILATION AND CURETTAGE OF UTERUS     idiopathic urticaria     LEG SURGERY Right 2020   NASH     osteoporosis     TONSILLECTOMY      Current Medications: Current Meds  Medication Sig   aspirin EC 81 MG tablet Take 81 mg by mouth daily.   clonazePAM (KLONOPIN) 0.5 MG tablet TAKE HALF TO ONE TABLET BY MOUTH TWICE A DAY AS NEEDED   ezetimibe (ZETIA) 10 MG tablet Take 1 tablet (10 mg  total) by mouth daily.   levothyroxine (SYNTHROID) 125 MCG tablet Take 1 tablet (125 mcg total) by mouth daily.   metoprolol succinate (TOPROL-XL) 25 MG 24 hr tablet Take 1 tablet (25 mg total) by mouth daily.   Misc Natural Products (LUTEIN VISION BLEND PO) Take by mouth daily.   nitroGLYCERIN (NITROSTAT) 0.4 MG SL tablet Place 1 tablet (0.4 mg total) under the tongue every 5 (five) minutes as needed for chest pain.   rosuvastatin (CRESTOR) 10 MG tablet Take 1 tablet (10 mg total) by mouth daily.   sertraline (ZOLOFT) 100 MG tablet Take 1 tablet (100 mg total) by mouth daily.   UNABLE TO  FIND at bedtime. CPAP     Allergies:   Penicillins   Social History   Socioeconomic History   Marital status: Widowed    Spouse name: Not on file   Number of children: Not on file   Years of education: Not on file   Highest education level: Not on file  Occupational History   Not on file  Tobacco Use   Smoking status: Never    Passive exposure: Never   Smokeless tobacco: Never  Vaping Use   Vaping Use: Never used  Substance and Sexual Activity   Alcohol use: Yes    Comment: occasional   Drug use: Never   Sexual activity: Not Currently  Other Topics Concern   Not on file  Social History Narrative   2 sons, 1 daughter (all in [place])   Widowed 2020   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 ROS:   Please see the history of present illness.    All other systems reviewed and are negative.  EKGs/Labs/Other Studies Reviewed:    The following studies were reviewed today:  EKG:  EKG ordered today and personally reviewed.  The ekg ordered today demonstrates sinus rhythm normal EKG  Recent Labs: 08/10/2020: ALT 26; BUN 19; Creatinine, Ser 0.61; Potassium 4.5; Sodium 140 02/10/2021: TSH 0.39  Recent Lipid Panel    Component Value Date/Time   CHOL 124 02/10/2021 0850   TRIG 129.0 02/10/2021  0850   HDL 50.90 02/10/2021 0850   CHOLHDL 2 02/10/2021 0850   VLDL 25.8 02/10/2021 0850   LDLCALC 47 02/10/2021 0850   LDLDIRECT 75.0 08/10/2020 0846    Physical Exam:    VS:  BP (!) 144/72 (BP Location: Left Arm)    Pulse 70    Ht 5' 1"  (1.549 m)    Wt 168 lb (76.2 kg)    SpO2 97%    BMI 31.74 kg/m     Wt Readings from Last 3 Encounters:  06/02/21 168 lb (76.2 kg)  02/10/21 163 lb (73.9 kg)  12/03/20 161 lb 6.4 oz (73.2 kg)  GEN: Appears her age looks healthy well nourished, well developed in no acute distress HEENT: Normal NECK: No JVD; No carotid bruits LYMPHATICS: No lymphadenopathy CARDIAC: RRR, no murmurs, rubs, gallops RESPIRATORY:  Clear to auscultation without rales, wheezing or rhonchi  ABDOMEN: Soft, non-tender, non-distended MUSCULOSKELETAL:  No edema; No deformity  SKIN: Warm and dry NEUROLOGIC:  Alert and oriented x 3 PSYCHIATRIC:  Normal affect    Signed, Shirlee More, MD  06/02/2021 2:18 PM    [facility]

## 2021-06-02 NOTE — Patient Instructions (Signed)

## 2021-06-03 ENCOUNTER — Ambulatory Visit: Payer: Medicare Other

## 2021-06-03 DIAGNOSIS — Z600 Problems of adjustment to life-cycle transitions: Secondary | ICD-10-CM | POA: Diagnosis not present

## 2021-06-03 DIAGNOSIS — F3181 Bipolar II disorder: Secondary | ICD-10-CM | POA: Diagnosis not present

## 2021-06-03 DIAGNOSIS — F411 Generalized anxiety disorder: Secondary | ICD-10-CM | POA: Diagnosis not present

## 2021-06-03 DIAGNOSIS — F4310 Post-traumatic stress disorder, unspecified: Secondary | ICD-10-CM | POA: Diagnosis not present

## 2021-06-07 ENCOUNTER — Ambulatory Visit (INDEPENDENT_AMBULATORY_CARE_PROVIDER_SITE_OTHER): Payer: Medicare Other

## 2021-06-07 ENCOUNTER — Telehealth: Payer: Self-pay | Admitting: Family

## 2021-06-07 VITALS — BP 144/83 | HR 65 | Temp 98.6°F | Resp 16 | Ht 61.0 in | Wt 162.0 lb

## 2021-06-07 DIAGNOSIS — Z0000 Encounter for general adult medical examination without abnormal findings: Secondary | ICD-10-CM | POA: Diagnosis not present

## 2021-06-07 NOTE — Progress Notes (Signed)
Subjective:   Rebecca Lutz is a 71 y.o. female who presents for Medicare Annual (Subsequent) preventive examination.  Review of Systems     Cardiac Risk Factors include: advanced age (>3mn, >>35women);dyslipidemia;hypertension;obesity (BMI >30kg/m2)     Objective:    Today's Vitals   06/07/21 0932  BP: (!) 144/83  Pulse: 65  Resp: 16  Temp: 98.6 F (37 C)  TempSrc: Oral  SpO2: 97%  Weight: 162 lb (73.5 kg)  Height: 5' 1"  (1.549 m)   Body mass index is 30.61 kg/m.  Advanced Directives 06/07/2021 05/28/2020 10/16/2019  Does Patient Have a Medical Advance Directive? Yes Yes Yes  Type of AParamedicof [place] will [place] will [place] will  Copy of [place] Chart? Yes - validated most recent copy scanned in chart (See row information) No - copy requested -    Current Medications (verified) Outpatient Encounter Medications as of 06/07/2021  Medication Sig   aspirin EC 81 MG tablet Take 81 mg by mouth daily.   clonazePAM (KLONOPIN) 0.5 MG tablet TAKE HALF TO ONE TABLET BY MOUTH TWICE A DAY AS NEEDED   ezetimibe (ZETIA) 10 MG tablet Take 1 tablet (10 mg total) by mouth daily.   levothyroxine (SYNTHROID) 125 MCG tablet Take 1 tablet (125 mcg total) by mouth daily.   metoprolol succinate (TOPROL-XL) 25 MG 24 hr tablet Take 1 tablet (25 mg total) by mouth daily.   Misc Natural Products (LUTEIN VISION BLEND PO) Take by mouth daily.   rosuvastatin (CRESTOR) 10 MG tablet Take 1 tablet (10 mg total) by mouth daily.   sertraline (ZOLOFT) 100 MG tablet Take 1 tablet (100 mg total) by mouth daily.   UNABLE TO FIND at bedtime. CPAP   nitroGLYCERIN (NITROSTAT) 0.4 MG SL tablet Place 1 tablet (0.4 mg total) under the tongue every 5 (five) minutes as needed for chest pain.   No facility-administered encounter medications on file as of 06/07/2021.    Allergies  (verified) Penicillins   History: Past Medical History:  Diagnosis Date   Anxiety    Asthma    Bipolar disorder ([place]    CAD (coronary artery disease)    MI 2005   Depression    Heart attack ([place]    High cholesterol    Hypertension    Idiopathic urticaria    NASH (nonalcoholic steatohepatitis)    Osteoporosis    previosly treated with bonivas   Peripheral neuropathy    Plantar fasciitis    Sciatica    Thyroid disease    Past Surgical History:  Procedure Laterality Date   APPENDECTOMY     DILATION AND CURETTAGE OF UTERUS     idiopathic urticaria     LEG SURGERY Right 2020   NASH     osteoporosis     TONSILLECTOMY     Family History  Problem Relation Age of Onset   Lung cancer Mother    COPD Mother    Heart attack Mother    Heart failure Mother    Suicidality Father        ? bipolar disorder   Depression Father    Cancer Daughter    Social History   Socioeconomic History   Marital status: Widowed    Spouse name: Not on file   Number of children: Not on file   Years of education: Not on file   Highest education level: Not on file  Occupational  History   Not on file  Tobacco Use   Smoking status: Never    Passive exposure: Never   Smokeless tobacco: Never  Vaping Use   Vaping Use: Never used  Substance and Sexual Activity   Alcohol use: Yes    Comment: occasional   Drug use: Never   Sexual activity: Not Currently  Other Topics Concern   Not on file  Social History Narrative   2 sons, 1 daughter (all in [place])   Widowed 2020   Retired- from [employer]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   She has a dog Engineer, drilling)   Completed some college   Enjoys yard work, spending time with her grandchildren, painting, coloring and needlepoint   Lives in independent living at [facility] Strain: Low Risk    Difficulty of Paying Living Expenses: Not hard at all  Food Insecurity: No Food  Insecurity   Worried About Charity fundraiser in the Last Year: Never true   Arboriculturist in the Last Year: Never true  Transportation Needs: No Transportation Needs   Lack of Transportation (Medical): No   Lack of Transportation (Non-Medical): No  Physical Activity: Insufficiently Active   Days of Exercise per Week: 2 days   Minutes of Exercise per Session: 60 min  Stress: No Stress Concern Present   Feeling of Stress : Not at all  Social Connections: Moderately Isolated   Frequency of Communication with Friends and Family: More than three times a week   Frequency of Social Gatherings with Friends and Family: More than three times a week   Attends Religious Services: More than 4 times per year   Active Member of [employer] or Organizations: No   Attends Archivist Meetings: Never   Marital Status: Widowed    Tobacco Counseling Counseling given: Not Answered   Clinical Intake:  Pre-visit preparation completed: Yes  Pain : No/denies pain     BMI - recorded: 30.61 Nutritional Status: BMI > 30  Obese Nutritional Risks: None Diabetes: No  How often do you need to have someone help you when you read instructions, pamphlets, or other written materials from your doctor or pharmacy?: 1 - Never  Diabetic?No  Interpreter Needed?: No  Information entered by :: Caroleen Hamman LPN   Activities of Daily Living In your present state of health, do you have any difficulty performing the following activities: 06/07/2021  Hearing? N  Vision? N  Difficulty concentrating or making decisions? N  Walking or climbing stairs? N  Dressing or bathing? N  Doing errands, shopping? N  Preparing Food and eating ? N  Using the Toilet? N  In the past six months, have you accidently leaked urine? Y  Do you have problems with loss of bowel control? N  Managing your Medications? N  Managing your Finances? N  Housekeeping or managing your Housekeeping? N  Some recent data might be  hidden    Patient Care Team: Debbrah Alar, NP as PCP - General (Internal Medicine)  Indicate any recent Medical Services you may have received from other than [facility] providers in the past year (date may be approximate).     Assessment:   This is a routine wellness examination for Rebecca Lutz.  Hearing/Vision screen Hearing Screening - Comments:: No issues Vision Screening - Comments:: Last eye exam-06/2020-[facility]  Dietary issues and exercise activities discussed: Current Exercise Habits:  Home exercise routine, Type of exercise: Other - see comments (pool aerobics), Time (Minutes): 60, Frequency (Times/Week): 2, Weekly Exercise (Minutes/Week): 120, Intensity: Mild, Exercise limited by: None identified   Goals Addressed             This Visit's Progress    Patient Stated   On track    Maintain current health       Depression Screen PHQ 2/9 Scores 06/07/2021 11/10/2020 05/28/2020  PHQ - 2 Score 0 0 1    Fall Risk Fall Risk  06/07/2021 11/10/2020 05/28/2020  Falls in the past year? 0 0 1  Number falls in past yr: 0 0 0  Injury with Fall? 0 0 1  Risk for fall due to : - - History of fall(s)  Follow up Falls prevention discussed - Falls prevention discussed    FALL RISK PREVENTION PERTAINING TO THE HOME:  Any stairs in or around the home? Yes  If so, are there any without handrails? Yes  Home free of loose throw rugs in walkways, pet beds, electrical cords, etc? Yes pt aware of falls risk Adequate lighting in your home to reduce risk of falls? Yes   ASSISTIVE DEVICES UTILIZED TO PREVENT FALLS:  Life alert? No  Use of a cane, walker or w/c? No  Grab bars in the bathroom? Yes  Shower chair or bench in shower? No  Elevated toilet seat or a handicapped toilet? No   TIMED UP AND GO:  Was the test performed? Yes .  Length of time to ambulate 10 feet: 11 sec.   Gait steady and fast without use of assistive device  Cognitive Function:Normal cognitive status assessed  by direct observation by this Nurse Health Advisor. No abnormalities found.          Immunizations Immunization History  Administered Date(s) Administered   Fluad Quad(high Dose 65+) 02/20/2019, 02/10/2021   Hepatitis A 02/21/2013, 08/22/2013   Hepatitis B 02/21/2013, 03/21/2013, 06/13/2013   Influenza-Unspecified 02/18/2020   PFIZER Comirnaty(Gray Top)Covid-19 Tri-Sucrose Vaccine 11/10/2020   PFIZER(Purple Top)SARS-COV-2 Vaccination 06/23/2019, 07/16/2019, 03/04/2020   Pfizer Covid-19 Vaccine Bivalent Booster 33yr & up 02/10/2021   Pneumococcal Conjugate-13 01/14/2014   Pneumococcal Polysaccharide-23 05/11/2020   Tdap 09/21/2020   Zoster Recombinat (Shingrix) 01/30/2017, 04/01/2017    TDAP status: Up to date  Flu Vaccine status: Up to date  Pneumococcal vaccine status: Up to date  Covid-19 vaccine status: Completed vaccines  Qualifies for Shingles Vaccine? No   Zostavax completed No   Shingrix Completed?: Yes  Screening Tests Health Maintenance  Topic Date Due   MAMMOGRAM  06/11/2022   COLONOSCOPY (Pts [phone] coverage will need to be confirmed)  05/07/2028   TETANUS/TDAP  09/22/2030   Pneumonia Vaccine 73Years old  Completed   INFLUENZA VACCINE  Completed   DEXA SCAN  Completed   COVID-19 Vaccine  Completed   Hepatitis C Screening  Completed   Zoster Vaccines- Shingrix  Completed   HPV VACCINES  Aged Out   FOOT EXAM  Discontinued   HEMOGLOBIN A1C  Discontinued   OPHTHALMOLOGY EXAM  Discontinued   URINE MICROALBUMIN  Discontinued    Health Maintenance  There are no preventive care reminders to display for this patient.  Colorectal cancer screening: Type of screening: Colonoscopy. Completed 05/07/2018. Repeat every 10 years  Mammogram status: Scheduled for 06/16/2021  Bone Density status: Completed 09/23/2020. Results reflect: Bone density results: OSTEOPENIA. Repeat every 2 years.  Lung Cancer Screening: (Low Dose CT Chest recommended if  Age  52-80 years, 30 pack-year currently smoking OR have quit w/in 15years.) does not qualify.     Additional Screening:  Hepatitis C Screening: Completed 05/11/2020  Vision Screening: Recommended annual ophthalmology exams for early detection of glaucoma and other disorders of the eye. Is the patient up to date with their annual eye exam?  Yes  Who is the provider or what is the name of the office in which the patient attends annual eye exams? [facility]  Dental Screening: Recommended annual dental exams for proper oral hygiene  Community Resource Referral / Chronic Care Management: CRR required this visit?  No   CCM required this visit?  No      Plan:     I have personally reviewed and noted the following in the patients chart:   Medical and social history Use of alcohol, tobacco or illicit drugs  Current medications and supplements including opioid prescriptions.  Functional ability and status Nutritional status Physical activity Advanced directives List of other physicians Hospitalizations, surgeries, and ER visits in previous 12 months Vitals Screenings to include cognitive, depression, and falls Referrals and appointments  In addition, I have reviewed and discussed with patient certain preventive protocols, quality metrics, and best practice recommendations. A written personalized care plan for preventive services as well as general preventive health recommendations were provided to patient.   Patient to access avs on mychart.  Marta Antu, LPN   6/80/3212  Nurse Health Advisor  Nurse Notes: None

## 2021-06-07 NOTE — Telephone Encounter (Signed)
OK with me.

## 2021-06-07 NOTE — Telephone Encounter (Signed)
Please advise pt per Dr. Tamela Oddi message.

## 2021-06-07 NOTE — Telephone Encounter (Signed)
Patient would like to [facility] to Dr Jonni Sanger. Patient is closer to this location & it would just be easier on her travel

## 2021-06-07 NOTE — Patient Instructions (Signed)
Rebecca Lutz , Thank you for taking time to come for your Medicare Wellness Visit. I appreciate your ongoing commitment to your health goals. Please review the following plan we discussed and let me know if I can assist you in the future.   Screening recommendations/referrals: Colonoscopy: Completed 05/07/2018-Due 05/07/2028 Mammogram: Scheduled for 06/16/2021 Bone Density: Completed 09/23/2020-Due 09/24/2022 Recommended yearly ophthalmology/optometry visit for glaucoma screening and checkup Recommended yearly dental visit for hygiene and checkup  Vaccinations: Influenza vaccine: Up to date Pneumococcal vaccine: Up to date Tdap vaccine: Up to date Shingles vaccine: Completed vaccines   Covid-19:Up to date  Advanced directives: Copy in chart  Conditions/risks identified: See problem list  Next appointment: Follow up in one year for your annual wellness visit    Preventive Care 7 Years and Older, Female Preventive care refers to lifestyle choices and visits with your health care provider that can promote health and wellness. What does preventive care include? A yearly physical exam. This is also called an annual well check. Dental exams once or twice a year. Routine eye exams. Ask your health care provider how often you should have your eyes checked. Personal lifestyle choices, including: Daily care of your teeth and gums. Regular physical activity. Eating a healthy diet. Avoiding tobacco and drug use. Limiting alcohol use. Practicing safe sex. Taking low-dose aspirin every day. Taking vitamin and mineral supplements as recommended by your health care provider. What happens during an annual well check? The services and screenings done by your health care provider during your annual well check will depend on your age, overall health, lifestyle risk factors, and family history of disease. Counseling  Your health care provider may ask you questions about your: Alcohol use. Tobacco  use. Drug use. Emotional well-being. Home and relationship well-being. Sexual activity. Eating habits. History of falls. Memory and ability to understand (cognition). Work and work Statistician. Reproductive health. Screening  You may have the following tests or measurements: Height, weight, and BMI. Blood pressure. Lipid and cholesterol levels. These may be checked every 5 years, or more frequently if you are over 21 years old. Skin check. Lung cancer screening. You may have this screening every year starting at age 62 if you have a 30-pack-year history of smoking and currently smoke or have quit within the past 15 years. Fecal occult blood test (FOBT) of the stool. You may have this test every year starting at age 63. Flexible sigmoidoscopy or colonoscopy. You may have a sigmoidoscopy every 5 years or a colonoscopy every 10 years starting at age 6. Hepatitis C blood test. Hepatitis B blood test. Sexually transmitted disease (STD) testing. Diabetes screening. This is done by checking your blood sugar (glucose) after you have not eaten for a while (fasting). You may have this done every 1-3 years. Bone density scan. This is done to screen for osteoporosis. You may have this done starting at age 68. Mammogram. This may be done every 1-2 years. Talk to your health care provider about how often you should have regular mammograms. Talk with your health care provider about your test results, treatment options, and if necessary, the need for more tests. Vaccines  Your health care provider may recommend certain vaccines, such as: Influenza vaccine. This is recommended every year. Tetanus, diphtheria, and acellular pertussis (Tdap, Td) vaccine. You may need a Td booster every 10 years. Zoster vaccine. You may need this after age 41. Pneumococcal 13-valent conjugate (PCV13) vaccine. One dose is recommended after age 49. Pneumococcal polysaccharide (PPSV23) vaccine.  One dose is recommended  after age 49. Talk to your health care provider about which screenings and vaccines you need and how often you need them. This information is not intended to replace advice given to you by your health care provider. Make sure you discuss any questions you have with your health care provider. Document Released: 05/22/2015 Document Revised: 01/13/2016 Document Reviewed: 02/24/2015 Elsevier Interactive Patient Education  2017 [place] Prevention in the Home Falls can cause injuries. They can happen to people of all ages. There are many things you can do to make your home safe and to help prevent falls. What can I do on the outside of my home? Regularly fix the edges of walkways and driveways and fix any cracks. Remove anything that might make you trip as you walk through a door, such as a raised step or threshold. Trim any bushes or trees on the path to your home. Use bright outdoor lighting. Clear any walking paths of anything that might make someone trip, such as rocks or tools. Regularly check to see if handrails are loose or broken. Make sure that both sides of any steps have handrails. Any raised decks and porches should have guardrails on the edges. Have any leaves, snow, or ice cleared regularly. Use sand or salt on walking paths during winter. Clean up any spills in your garage right away. This includes oil or grease spills. What can I do in the bathroom? Use night lights. Install grab bars by the toilet and in the tub and shower. Do not use towel bars as grab bars. Use non-skid mats or decals in the tub or shower. If you need to sit down in the shower, use a plastic, non-slip stool. Keep the floor dry. Clean up any water that spills on the floor as soon as it happens. Remove soap buildup in the tub or shower regularly. Attach bath mats securely with double-sided non-slip rug tape. Do not have throw rugs and other things on the floor that can make you trip. What can I do  in the bedroom? Use night lights. Make sure that you have a light by your bed that is easy to reach. Do not use any sheets or blankets that are too big for your bed. They should not hang down onto the floor. Have a firm chair that has side arms. You can use this for support while you get dressed. Do not have throw rugs and other things on the floor that can make you trip. What can I do in the kitchen? Clean up any spills right away. Avoid walking on wet floors. Keep items that you use a lot in easy-to-reach places. If you need to reach something above you, use a strong step stool that has a grab bar. Keep electrical cords out of the way. Do not use floor polish or wax that makes floors slippery. If you must use wax, use non-skid floor wax. Do not have throw rugs and other things on the floor that can make you trip. What can I do with my stairs? Do not leave any items on the stairs. Make sure that there are handrails on both sides of the stairs and use them. Fix handrails that are broken or loose. Make sure that handrails are as long as the stairways. Check any carpeting to make sure that it is firmly attached to the stairs. Fix any carpet that is loose or worn. Avoid having throw rugs at the top or bottom of  the stairs. If you do have throw rugs, attach them to the floor with carpet tape. Make sure that you have a light switch at the top of the stairs and the bottom of the stairs. If you do not have them, ask someone to add them for you. What else can I do to help prevent falls? Wear shoes that: Do not have high heels. Have rubber bottoms. Are comfortable and fit you well. Are closed at the toe. Do not wear sandals. If you use a stepladder: Make sure that it is fully opened. Do not climb a closed stepladder. Make sure that both sides of the stepladder are locked into place. Ask someone to hold it for you, if possible. Clearly mark and make sure that you can see: Any grab bars or  handrails. First and last steps. Where the edge of each step is. Use tools that help you move around (mobility aids) if they are needed. These include: Canes. Walkers. Scooters. Crutches. Turn on the lights when you go into a dark area. Replace any light bulbs as soon as they burn out. Set up your furniture so you have a clear path. Avoid moving your furniture around. If any of your floors are uneven, fix them. If there are any pets around you, be aware of where they are. Review your medicines with your doctor. Some medicines can make you feel dizzy. This can increase your chance of falling. Ask your doctor what other things that you can do to help prevent falls. This information is not intended to replace advice given to you by your health care provider. Make sure you discuss any questions you have with your health care provider. Document Released: 02/19/2009 Document Revised: 10/01/2015 Document Reviewed: 05/30/2014 Elsevier Interactive Patient Education  2017 [employer].

## 2021-06-07 NOTE — Telephone Encounter (Signed)
Patient states Dr Cherlynn Kaiser.  Can we TOC to [facility]?  Please advise

## 2021-06-08 DIAGNOSIS — G4733 Obstructive sleep apnea (adult) (pediatric): Secondary | ICD-10-CM | POA: Diagnosis not present

## 2021-06-16 ENCOUNTER — Other Ambulatory Visit: Payer: Self-pay | Admitting: Family Medicine

## 2021-06-16 ENCOUNTER — Ambulatory Visit
Admission: RE | Admit: 2021-06-16 | Discharge: 2021-06-16 | Disposition: A | Discharge status: Home / Self Care | Payer: Medicare Other | Source: Ambulatory Visit

## 2021-06-16 DIAGNOSIS — Z1231 Encounter for screening mammogram for malignant neoplasm of breast: Secondary | ICD-10-CM | POA: Diagnosis not present

## 2021-06-25 ENCOUNTER — Encounter: Payer: Self-pay | Admitting: Family

## 2021-07-20 ENCOUNTER — Encounter: Payer: Self-pay | Admitting: Family Medicine

## 2021-07-20 ENCOUNTER — Ambulatory Visit (INDEPENDENT_AMBULATORY_CARE_PROVIDER_SITE_OTHER): Payer: Medicare Other | Admitting: Family Medicine

## 2021-07-20 VITALS — BP 140/80 | HR 61 | Temp 97.5°F | Ht 61.0 in | Wt 158.2 lb

## 2021-07-20 DIAGNOSIS — I10 Essential (primary) hypertension: Secondary | ICD-10-CM | POA: Diagnosis not present

## 2021-07-20 DIAGNOSIS — F319 Bipolar disorder, unspecified: Secondary | ICD-10-CM

## 2021-07-20 DIAGNOSIS — E038 Other specified hypothyroidism: Secondary | ICD-10-CM

## 2021-07-20 DIAGNOSIS — I2510 Atherosclerotic heart disease of native coronary artery without angina pectoris: Secondary | ICD-10-CM

## 2021-07-20 LAB — COMPREHENSIVE METABOLIC PANEL
ALT: 45 U/L — ABNORMAL HIGH (ref 0–35)
AST: 33 U/L (ref 0–37)
Albumin: 4.6 g/dL (ref 3.5–5.2)
Alkaline Phosphatase: 65 U/L (ref 39–117)
BUN: 12 mg/dL (ref 6–23)
CO2: 26 mEq/L (ref 19–32)
Calcium: 9.7 mg/dL (ref 8.4–10.5)
Chloride: 105 mEq/L (ref 96–112)
Creatinine, Ser: 0.68 mg/dL (ref 0.40–1.20)
GFR: 86.89 mL/min (ref >60.00)
Glucose, Bld: 100 mg/dL — ABNORMAL HIGH (ref 70–99)
Potassium: 4 mEq/L (ref 3.5–5.1)
Sodium: 141 mEq/L (ref 135–145)
Total Bilirubin: 0.7 mg/dL (ref 0.2–1.2)
Total Protein: 6.5 g/dL (ref 6.0–8.3)

## 2021-07-20 LAB — CBC WITH DIFFERENTIAL/PLATELET
Basophils Absolute: 0 10*3/uL (ref 0.0–0.1)
Basophils Relative: 0.6 % (ref 0.0–3.0)
Eosinophils Absolute: 0.2 10*3/uL (ref 0.0–0.7)
Eosinophils Relative: 4.3 % (ref 0.0–5.0)
HCT: 35.9 % — ABNORMAL LOW (ref 36.0–46.0)
Hemoglobin: 12.3 g/dL (ref 12.0–15.0)
Lymphocytes Relative: 26.7 % (ref 12.0–46.0)
Lymphs Abs: 1.1 10*3/uL (ref 0.7–4.0)
MCHC: 34.3 g/dL (ref 30.0–36.0)
MCV: 85.6 fl (ref 78.0–100.0)
Monocytes Absolute: 0.3 10*3/uL (ref 0.1–1.0)
Monocytes Relative: 6.2 % (ref 3.0–12.0)
Neutro Abs: 2.5 10*3/uL (ref 1.4–7.7)
Neutrophils Relative %: 62.2 % (ref 43.0–77.0)
Platelets: 142 10*3/uL — ABNORMAL LOW (ref 150.0–400.0)
RBC: 4.19 Mil/uL (ref 3.87–5.11)
RDW: 13.4 % (ref 11.5–15.5)
WBC: 4 10*3/uL (ref 4.0–10.5)

## 2021-07-20 LAB — TSH: TSH: 0.04 u[IU]/mL — ABNORMAL LOW (ref 0.35–5.50)

## 2021-07-20 LAB — LIPID PANEL
Cholesterol: 124 mg/dL (ref 0–200)
HDL: 49.8 mg/dL (ref >39.00)
LDL Cholesterol: 49 mg/dL (ref 0–99)
NonHDL: 74.14
Total CHOL/HDL Ratio: 2
Triglycerides: 126 mg/dL (ref 0.0–149.0)
VLDL: 25.2 mg/dL (ref 0.0–40.0)

## 2021-07-20 MED ORDER — EZETIMIBE 10 MG PO TABS: 10.0000 mg | ORAL_TABLET | Freq: Every day | ORAL | 3 refills | Status: DC

## 2021-07-20 MED ORDER — SERTRALINE HCL 100 MG PO TABS: 100.0000 mg | ORAL_TABLET | Freq: Every day | ORAL | 1 refills | Status: DC

## 2021-07-20 NOTE — Patient Instructions (Signed)
It was very nice to see you today! ? ?Glad you are doing well. ? ? ?PLEASE NOTE: ? ?If you had any lab tests please let us know if you have not heard back within a few days. You may see your results on MyChart before we have a chance to review them but we will give you a call once they are reviewed by [place]. If we ordered any referrals today, please let us know if you have not heard from their office within the next week.  ? ?Please try these tips to maintain a healthy lifestyle: ? ?Eat most of your calories during the day when you are active. Eliminate processed foods including packaged sweets (pies, cakes, cookies), reduce intake of potatoes, white bread, white pasta, and white rice. Look for whole grain options, oat flour or almond flour. ? ?Each meal should contain half fruits/vegetables, one quarter protein, and one quarter carbs (no bigger than a computer mouse). ? ?Cut down on sweet beverages. This includes juice, soda, and sweet tea. Also watch fruit intake, though this is a healthier sweet option, it still contains natural sugar! Limit to 3 servings daily. ? ?Drink at least 1 glass of water with each meal and aim for at least 8 glasses per day ? ?Exercise at least 150 minutes every week.   ?

## 2021-07-20 NOTE — Progress Notes (Signed)
? ?Subjective:  ? ? ? Patient ID: Rebecca Lutz, female    DOB: 1950-12-16, 71 y.o.   MRN: [id number] ? ?Chief Complaint  ?Patient presents with  ? Transfer of Care  ?  Fasting ?  ? ? ?HPI ? CAD-seeing Card.  No CP.  Stable on meds ?HTN-bp's-not checking.  Lately a little higher in docs office-thinks may be food at [facility]. No dizziness/cp/palp/edema/cough/sob.  Still active. Occ HA ?Hypothyroidism-stable.  No problems. ?[facility] manages.  Moved to [place] in Fall and went into more depression.  Saw therapist and helped,  PCP has been managing meds until Fall.Had been stable for long time till move.  Dx 109yr ago. Stable now. No SI.  Dad committed suicide ? ?There are no preventive care reminders to display for this patient. ? ?Past Medical History:  ?Diagnosis Date  ? Anxiety   ? Asthma   ? Bipolar disorder ([place]   ? CAD (coronary artery disease)   ? MI 2007  ? Depression   ? [place]   ? High cholesterol   ? Hypertension   ? Idiopathic urticaria   ? NASH (nonalcoholic steatohepatitis)   ? Osteoporosis   ? previosly treated with bonivas  ? Peripheral neuropathy   ? Plantar fasciitis   ? Sciatica   ? Thyroid disease   ? ? ?Past Surgical History:  ?Procedure Laterality Date  ? APPENDECTOMY    ? DILATION AND CURETTAGE OF UTERUS    ? idiopathic urticaria    ? LEG SURGERY Right 2020  ? poss achilles for plantar fasciitis  ? NASH    ? osteoporosis    ? TONSILLECTOMY    ? ? ?Outpatient Medications Prior to Visit  ?Medication Sig Dispense Refill  ? aspirin EC 81 MG tablet Take 81 mg by mouth daily.    ? clonazePAM (KLONOPIN) 0.5 MG tablet TAKE HALF TO ONE TABLET BY MOUTH TWICE A DAY AS NEEDED 60 tablet 2  ? ezetimibe (ZETIA) 10 MG tablet TAKE ONE TABLET BY MOUTH ONCE DAILY 90 tablet 0  ? levothyroxine (SYNTHROID) 125 MCG tablet Take 1 tablet (125 mcg total) by mouth daily. 90 tablet 1  ? metoprolol succinate (TOPROL-XL) 25 MG 24 hr tablet Take 1 tablet (25 mg total) by mouth daily. 90 tablet  1  ? Misc Natural Products (LUTEIN VISION BLEND PO) Take by mouth daily.    ? rosuvastatin (CRESTOR) 10 MG tablet Take 1 tablet (10 mg total) by mouth daily. 90 tablet 1  ? sertraline (ZOLOFT) 100 MG tablet Take 1 tablet (100 mg total) by mouth daily. 90 tablet 1  ? UNABLE TO FIND at bedtime. CPAP    ? nitroGLYCERIN (NITROSTAT) 0.4 MG SL tablet Place 1 tablet (0.4 mg total) under the tongue every 5 (five) minutes as needed for chest pain. 90 tablet 3  ? ?No facility-administered medications prior to visit.  ? ? ?Allergies  ?Allergen Reactions  ? Penicillins Rash  ? ?ROS neg/noncontributory except as noted HPI/below ?No n/v/d/c/abd pain ?No severe arthralgias ? ? ?   ?Objective:  ?  ? ?BP 140/80   Pulse 61   Temp (!) 97.5 ?F (36.4 ?C) (Temporal)   Ht 5' 1"  (1.549 m)   Wt 158 lb 4 oz (71.8 kg)   SpO2 96%   BMI 29.90 kg/m?  ?Wt Readings from Last 3 Encounters:  ?07/20/21 158 lb 4 oz (71.8 kg)  ?06/07/21 162 lb (73.5 kg)  ?06/02/21 168 lb (76.2 kg)  ? ? ?  Physical Exam  ? ?Gen: WDWN NAD WF ?HEENT: NCAT, conjunctiva not injected, sclera nonicteric ?NECK:  supple, no thyromegaly, no nodes, no carotid bruits ?CARDIAC: RRR, S1S2+, no murmur. DP 2+B ?LUNGS: CTAB. No wheezes ?ABDOMEN:  BS+, soft, NTND, No HSM, no masses ?EXT:  no edema ?MSK: no gross abnormalities.  ?NEURO: A&O x3.  CN II-XII intact.  ?PSYCH: normal mood. Good eye contact ? ?   ?Assessment & Plan:  ? ?Problem List Items Addressed This Visit   ? ?  ? Cardiovascular and Mediastinum  ? Essential hypertension  ? CAD (coronary artery disease) - Primary  ?  ? Endocrine  ? Hypothyroidism  ?  ? Other  ? Bipolar depression ([place])  ? CAD-no angina.  Stable. Cont meds.  Check cbc,cmp,lipids ?HTN-controlled.  Some inc w/poss inc salt.  Work on [employer].  Check bp's weekly(has nurse at facility).  F/u if stays up ?Hypothyroidism-stable on meds.  Check TSH. Cont ?Bipolar-stable on meds now.  Seeing MH.  Cont. ? ?No orders of the defined types were placed in this  encounter. ? ? ?Wellington Hampshire, MD ? ?

## 2021-07-21 ENCOUNTER — Other Ambulatory Visit: Payer: Self-pay | Admitting: Family Medicine

## 2021-07-21 DIAGNOSIS — E039 Hypothyroidism, unspecified: Secondary | ICD-10-CM

## 2021-08-16 ENCOUNTER — Ambulatory Visit: Payer: Medicare Other | Admitting: Family

## 2021-08-17 ENCOUNTER — Other Ambulatory Visit: Payer: Self-pay | Admitting: Family Medicine

## 2021-08-17 DIAGNOSIS — F3181 Bipolar II disorder: Secondary | ICD-10-CM | POA: Diagnosis not present

## 2021-08-17 DIAGNOSIS — F411 Generalized anxiety disorder: Secondary | ICD-10-CM | POA: Diagnosis not present

## 2021-08-31 ENCOUNTER — Telehealth: Payer: Self-pay | Admitting: Family

## 2021-08-31 ENCOUNTER — Other Ambulatory Visit: Payer: Self-pay | Admitting: *Deleted

## 2021-08-31 ENCOUNTER — Telehealth: Payer: Self-pay | Admitting: Family Medicine

## 2021-08-31 MED ORDER — ROSUVASTATIN CALCIUM 10 MG PO TABS: 10.0000 mg | ORAL_TABLET | Freq: Every day | ORAL | 1 refills | Status: DC

## 2021-08-31 NOTE — Telephone Encounter (Signed)
Rx sent to the pharmacy.

## 2021-08-31 NOTE — Telephone Encounter (Signed)
Pt states 3 month supply is preferred, but not required ? ? .. ?Encourage patient to contact the pharmacy for refills or they can request refills through [facility] ? ?LAST APPOINTMENT DATE:   ?07/20/21 ? ?NEXT APPOINTMENT DATE: ?01/20/22 ? ?MEDICATION: ?rosuvastatin (CRESTOR) 10 MG tablet [[id number]]  ? ?Is the patient out of medication?  ?6 days left ? ?PHARMACY: ?[place], [place], [place] [address]  ?Phone:  [phone]  Fax:  [phone]  ?DEA #:  -- ?DAW Reason: -- ? ? ? ?Let patient know to contact pharmacy at the end of the day to make sure medication is ready. ? ?Please notify patient to allow 48-72 hours to process  ?

## 2021-09-03 NOTE — Telephone Encounter (Signed)
error 

## 2021-09-07 DIAGNOSIS — G4733 Obstructive sleep apnea (adult) (pediatric): Secondary | ICD-10-CM | POA: Diagnosis not present

## 2021-12-08 DIAGNOSIS — G4733 Obstructive sleep apnea (adult) (pediatric): Secondary | ICD-10-CM | POA: Diagnosis not present

## 2022-01-20 ENCOUNTER — Ambulatory Visit (INDEPENDENT_AMBULATORY_CARE_PROVIDER_SITE_OTHER): Payer: Medicare Other | Admitting: Family Medicine

## 2022-01-20 ENCOUNTER — Encounter: Payer: Self-pay | Admitting: Family Medicine

## 2022-01-20 VITALS — BP 144/76 | HR 79 | Temp 97.5°F | Ht 61.0 in | Wt 154.4 lb

## 2022-01-20 DIAGNOSIS — R202 Paresthesia of skin: Secondary | ICD-10-CM

## 2022-01-20 DIAGNOSIS — I10 Essential (primary) hypertension: Secondary | ICD-10-CM

## 2022-01-20 DIAGNOSIS — G4489 Other headache syndrome: Secondary | ICD-10-CM

## 2022-01-20 DIAGNOSIS — E038 Other specified hypothyroidism: Secondary | ICD-10-CM

## 2022-01-20 DIAGNOSIS — I2510 Atherosclerotic heart disease of native coronary artery without angina pectoris: Secondary | ICD-10-CM

## 2022-01-20 DIAGNOSIS — F319 Bipolar disorder, unspecified: Secondary | ICD-10-CM

## 2022-01-20 LAB — TSH: TSH: 0.07 u[IU]/mL — ABNORMAL LOW (ref 0.35–5.50)

## 2022-01-20 LAB — COMPREHENSIVE METABOLIC PANEL
ALT: 54 U/L — ABNORMAL HIGH (ref 0–35)
AST: 46 U/L — ABNORMAL HIGH (ref 0–37)
Albumin: 4.4 g/dL (ref 3.5–5.2)
Alkaline Phosphatase: 63 U/L (ref 39–117)
BUN: 14 mg/dL (ref 6–23)
CO2: 28 mEq/L (ref 19–32)
Calcium: 10 mg/dL (ref 8.4–10.5)
Chloride: 104 mEq/L (ref 96–112)
Creatinine, Ser: 0.73 mg/dL (ref 0.40–1.20)
GFR: 81.76 mL/min (ref >60.00)
Glucose, Bld: 100 mg/dL — ABNORMAL HIGH (ref 70–99)
Potassium: 4 mEq/L (ref 3.5–5.1)
Sodium: 140 mEq/L (ref 135–145)
Total Bilirubin: 0.8 mg/dL (ref 0.2–1.2)
Total Protein: 6.9 g/dL (ref 6.0–8.3)

## 2022-01-20 LAB — CBC WITH DIFFERENTIAL/PLATELET
Basophils Absolute: 0 10*3/uL (ref 0.0–0.1)
Basophils Relative: 0.6 % (ref 0.0–3.0)
Eosinophils Absolute: 0.2 10*3/uL (ref 0.0–0.7)
Eosinophils Relative: 3.4 % (ref 0.0–5.0)
HCT: 36.5 % (ref 36.0–46.0)
Hemoglobin: 12.4 g/dL (ref 12.0–15.0)
Lymphocytes Relative: 32.2 % (ref 12.0–46.0)
Lymphs Abs: 1.5 10*3/uL (ref 0.7–4.0)
MCHC: 33.9 g/dL (ref 30.0–36.0)
MCV: 85.7 fl (ref 78.0–100.0)
Monocytes Absolute: 0.3 10*3/uL (ref 0.1–1.0)
Monocytes Relative: 6.6 % (ref 3.0–12.0)
Neutro Abs: 2.6 10*3/uL (ref 1.4–7.7)
Neutrophils Relative %: 57.2 % (ref 43.0–77.0)
Platelets: 141 10*3/uL — ABNORMAL LOW (ref 150.0–400.0)
RBC: 4.26 Mil/uL (ref 3.87–5.11)
RDW: 13.6 % (ref 11.5–15.5)
WBC: 4.6 10*3/uL (ref 4.0–10.5)

## 2022-01-20 LAB — MICROALBUMIN / CREATININE URINE RATIO
Creatinine,U: 159.8 mg/dL
Microalb Creat Ratio: 0.5 mg/g (ref 0.0–30.0)
Microalb, Ur: 0.8 mg/dL (ref 0.0–1.9)

## 2022-01-20 LAB — VITAMIN B12: Vitamin B-12: 172 pg/mL — ABNORMAL LOW (ref 211–911)

## 2022-01-20 LAB — HEMOGLOBIN A1C: Hgb A1c MFr Bld: 5.5 % (ref 4.6–6.5)

## 2022-01-20 MED ORDER — ROSUVASTATIN CALCIUM 10 MG PO TABS
10.0000 mg | ORAL_TABLET | Freq: Every day | ORAL | 1 refills | Status: DC
Start: 2022-01-20 — End: 2022-08-25

## 2022-01-20 MED ORDER — METOPROLOL SUCCINATE ER 50 MG PO TB24
50.0000 mg | ORAL_TABLET | Freq: Every day | ORAL | 3 refills | Status: DC
Start: 2022-01-20 — End: 2022-02-23

## 2022-01-20 MED ORDER — SERTRALINE HCL 100 MG PO TABS: 100.0000 mg | ORAL_TABLET | Freq: Every day | ORAL | 1 refills | Status: DC

## 2022-01-20 MED ORDER — ARIPIPRAZOLE 2 MG PO TABS: 2.0000 mg | ORAL_TABLET | Freq: Every day | ORAL | 1 refills | Status: DC

## 2022-01-20 NOTE — Progress Notes (Signed)
Subjective:     Patient ID: Rebecca Lutz, female    DOB: 28-Nov-1950, 71 y.o.   MRN: [id number]  Chief Complaint  Patient presents with   Follow-up    6 month follow-up for HTN and thyroid     HPI  CAD-Dr. Purvis Sheffield annually.  On zetia and crestor HTN-Pt is on metoprolol  Bp's running 140/80.  No palp/edema/cough/sob.  Some CP at night-more anxiety(doesn't sleep well)  Hypothyroidism-on meds.  No complaints. Bipolar-occ sees MH.  Doing well on zoloft.  No SI.  Doesn't sleep well ever-clonazepam not working so not taking for 3 mo.   OSA-seeing Pulm.  Wearing cpap  Health Maintenance Due  Topic Date Due   Diabetic kidney evaluation - Urine ACR  Never done    Past Medical History:  Diagnosis Date   Anxiety    Asthma    Bipolar disorder ([place])    CAD (coronary artery disease)    MI 2007   Depression    Heart attack ([place])    High cholesterol    Hypertension    Idiopathic urticaria    NASH (nonalcoholic steatohepatitis)    Osteoporosis    previosly treated with bonivas   Peripheral neuropathy    Plantar fasciitis    Sciatica    Thyroid disease     Past Surgical History:  Procedure Laterality Date   APPENDECTOMY     DILATION AND CURETTAGE OF UTERUS     idiopathic urticaria     LEG SURGERY Right 2020   poss achilles for plantar fasciitis   NASH     osteoporosis     TONSILLECTOMY      Outpatient Medications Prior to Visit  Medication Sig Dispense Refill   aspirin EC 81 MG tablet Take 81 mg by mouth daily.     ezetimibe (ZETIA) 10 MG tablet Take 1 tablet (10 mg total) by mouth daily. 90 tablet 3   Misc Natural Products (LUTEIN VISION BLEND PO) Take by mouth daily.     PREVIDENT 5000 BOOSTER PLUS 1.1 % PSTE Place onto teeth.     SYNTHROID 125 MCG tablet take 1/2 tablet by mouth 1 day a week and take 1 tablet the other six days on empty stomach with full glass of water. 90 tablet 4   UNABLE TO FIND at bedtime. CPAP     clonazePAM (KLONOPIN) 0.5 MG tablet  TAKE HALF TO ONE TABLET BY MOUTH TWICE A DAY AS NEEDED 60 tablet 2   metoprolol succinate (TOPROL-XL) 25 MG 24 hr tablet TAKE ONE TABLET BY MOUTH ONCE DAILY 90 tablet 3   rosuvastatin (CRESTOR) 10 MG tablet Take 1 tablet (10 mg total) by mouth daily. 90 tablet 1   sertraline (ZOLOFT) 100 MG tablet Take 1 tablet (100 mg total) by mouth daily. 90 tablet 1   nitroGLYCERIN (NITROSTAT) 0.4 MG SL tablet Place 1 tablet (0.4 mg total) under the tongue every 5 (five) minutes as needed for chest pain. 90 tablet 3   No facility-administered medications prior to visit.    Allergies  Allergen Reactions   Penicillins Rash   ROS neg/noncontributory except as noted HPI/below Getting a little more HA and occ dizziness than "used to".  More in daytime-not waking up w/them.  Some mid epi discomfort and stools changing-more round rocks.  Cscope about 2-3 yrs ago(04/2018).  Doesn't drink water much.  Past 5 mo-4 episodes of feeling like something "popped in head" and sees like "flashbulb" goes off.   Last  episode-"pop/flash" in July and then like electric current going thru whole body.  Only lasts few seconds. Twice lying down, twice sitting up.   Sweats more-takes shower and then sweats after.  Can occur at HS.  Feet feeling "weird" for 70yr-tingly but not numb and just "feels weird"   and now, starting in fingers.      Objective:     BP (!) 144/76   Pulse 79   Temp (!) 97.5 F (36.4 C) (Temporal)   Ht 5' 1"  (1.549 m)   Wt 154 lb 6 oz (70 kg)   SpO2 97%   BMI 29.17 kg/m  Wt Readings from Last 3 Encounters:  01/20/22 154 lb 6 oz (70 kg)  07/20/21 158 lb 4 oz (71.8 kg)  06/07/21 162 lb (73.5 kg)    Physical Exam   Gen: WDWN NAD wf HEENT: NCAT, conjunctiva not injected, sclera nonicteric NECK:  supple, no thyromegaly, no nodes, no carotid bruits CARDIAC: RRR, S1S2+, no murmur. DP 2+B LUNGS: CTAB. No wheezes ABDOMEN:  BS+, soft, NTND, No HSM, no masses EXT:  no edema MSK: no gross  abnormalities.  NEURO: A&O x3.  CN II-XII intact.  PSYCH: normal mood. Good eye contact     Assessment & Plan:   Problem List Items Addressed This Visit       Cardiovascular and Mediastinum   Essential hypertension - Primary   Relevant Medications   metoprolol succinate (TOPROL-XL) 50 MG 24 hr tablet   rosuvastatin (CRESTOR) 10 MG tablet   Other Relevant Orders   Comprehensive metabolic panel   CBC with Differential/Platelet   Microalbumin / creatinine urine ratio   CAD (coronary artery disease)   Relevant Medications   metoprolol succinate (TOPROL-XL) 50 MG 24 hr tablet   rosuvastatin (CRESTOR) 10 MG tablet     Endocrine   Hypothyroidism   Relevant Medications   metoprolol succinate (TOPROL-XL) 50 MG 24 hr tablet   Other Relevant Orders   TSH     Other   Bipolar depression ([place]   Other Visit Diagnoses     Paresthesia       Relevant Orders   Hemoglobin A1c   Vitamin B12   MR Brain Wo Contrast   Other headache syndrome       Relevant Medications   metoprolol succinate (TOPROL-XL) 50 MG 24 hr tablet   sertraline (ZOLOFT) 100 MG tablet   Other Relevant Orders   MR Brain Wo Contrast     1.  Hypertension-chronic.  Not controlled.  Will increase metoprolol XL to 50 mg daily.  Check CBC, CMP, urine microalbumin creatinine.  Follow-up 1 month 2.  Hypothyroidism-chronic.  Controlled on Synthroid 0.125 mg daily (half tab once a week) check TSH 3.  Coronary artery disease-chronic.  Stable.  Occasional chest pain which she relates to lack of sleep and worried about it.  Managed by cardiology.  Continue Zetia 10 mg and rosuvastatin 10 mg.  LDL is at goal. 4.  Bipolar disorder-chronic.  Well-controlled on Zoloft 100 mg daily.  She sees mental health occasionally.  Biggest problem is insomnia.  Clonazepam did not help so she has not been taking it.  Will trial Abilify 2 mg at at bedtime.  Follow-up in 1 month 5.  Headache-intermittent.  Getting worse.  Also having unusual  symptoms of quick brain zaps.  And flash of light.  While this may be related to elevated blood pressure, is not that severe.  Need to check MRI due to patient's  age, new headache, other symptoms. 6.  Paresthesias feet, and hands.  Chronic but the hands are now new.  We will check vitamin B12 level, hemoglobin A1c to rule out diabetes.  Also checking MRI of the brain for headache as above.  Meds ordered this encounter  Medications   metoprolol succinate (TOPROL-XL) 50 MG 24 hr tablet    Sig: Take 1 tablet (50 mg total) by mouth daily.    Dispense:  90 tablet    Refill:  3   rosuvastatin (CRESTOR) 10 MG tablet    Sig: Take 1 tablet (10 mg total) by mouth daily.    Dispense:  90 tablet    Refill:  1   sertraline (ZOLOFT) 100 MG tablet    Sig: Take 1 tablet (100 mg total) by mouth daily.    Dispense:  90 tablet    Refill:  1   ARIPiprazole (ABILIFY) 2 MG tablet    Sig: Take 1 tablet (2 mg total) by mouth daily.    Dispense:  30 tablet    Refill:  1    Wellington Hampshire, MD

## 2022-01-20 NOTE — Patient Instructions (Signed)
It was very nice to see you today!  Increase the metoprolol to 27m    add abilify at bedtime.     PLEASE NOTE:  If you had any lab tests please let [facility] if you have not heard back within a few days. You may see your results on MyChart before we have a chance to review them but we will give you a call once they are reviewed by [facility] If we ordered any referrals today, please let [facility] if you have not heard from their office within the next week.   Please try these tips to maintain a healthy lifestyle:  Eat most of your calories during the day when you are active. Eliminate processed foods including packaged sweets (pies, cakes, cookies), reduce intake of potatoes, white bread, white pasta, and white rice. Look for whole grain options, oat flour or almond flour.  Each meal should contain half fruits/vegetables, one quarter protein, and one quarter carbs (no bigger than a computer mouse).  Cut down on sweet beverages. This includes juice, soda, and sweet tea. Also watch fruit intake, though this is a healthier sweet option, it still contains natural sugar! Limit to 3 servings daily.  Drink at least 1 glass of water with each meal and aim for at least 8 glasses per day  Exercise at least 150 minutes every week.

## 2022-01-23 NOTE — Progress Notes (Signed)
Abnormal labs: 1.  Liver tests a little elevated-does she drink alcohol?  If not-probably meds and thyroid 2.  Dose of thyroid still too much-does she want to decrease dose to 100 and reck 2-3 months or take same but 2 days/week only take 1/2 tab? 3.  B12 is low-does she want to try otc B12 1000 mcg/d or do injections?

## 2022-01-25 ENCOUNTER — Other Ambulatory Visit: Payer: Self-pay | Admitting: *Deleted

## 2022-01-25 ENCOUNTER — Encounter: Payer: Self-pay | Admitting: Family Medicine

## 2022-01-25 DIAGNOSIS — E039 Hypothyroidism, unspecified: Secondary | ICD-10-CM

## 2022-01-25 DIAGNOSIS — R7989 Other specified abnormal findings of blood chemistry: Secondary | ICD-10-CM

## 2022-01-25 DIAGNOSIS — E538 Deficiency of other specified B group vitamins: Secondary | ICD-10-CM

## 2022-01-25 MED ORDER — LEVOTHYROXINE SODIUM 100 MCG PO TABS: 100.0000 ug | ORAL_TABLET | Freq: Every day | ORAL | 3 refills | Status: DC

## 2022-01-25 NOTE — Addendum Note (Signed)
Addended by: Zacarias Pontes on: 01/25/2022 05:18 PM   Modules accepted: Orders

## 2022-01-26 ENCOUNTER — Encounter: Payer: Self-pay | Admitting: Pulmonary Disease

## 2022-01-26 ENCOUNTER — Ambulatory Visit (INDEPENDENT_AMBULATORY_CARE_PROVIDER_SITE_OTHER): Payer: Medicare Other | Admitting: Pulmonary Disease

## 2022-01-26 VITALS — BP 112/68 | HR 70 | Temp 98.1°F | Ht 61.0 in | Wt 156.0 lb

## 2022-01-26 DIAGNOSIS — G4733 Obstructive sleep apnea (adult) (pediatric): Secondary | ICD-10-CM | POA: Diagnosis not present

## 2022-01-26 DIAGNOSIS — Z9989 Dependence on other enabling machines and devices: Secondary | ICD-10-CM

## 2022-01-26 NOTE — Patient Instructions (Signed)
DME referral for auto CPAP  Machine becoming dysfunctional -Auto CPAP 5-15 with heated humidification  As we discussed, you may require a home sleep study-this depends on insurance requirement  I will see you tentatively in about 3 to 4 months  For now continue using your CPAP  Call us with significant concerns

## 2022-01-26 NOTE — Progress Notes (Signed)
@Patient  ID: Rebecca Lutz, female    DOB: 21-[name]-1951, 71 y.o.   MRN: [id number]  Obstructive sleep apnea Tolerating CPAP well Referring provider: Tawnya Crook, MD  HPI:  71 year old female with obstructive sleep apnea No significant complaints today  Machine becoming dysfunctional, becoming noisy  On CPAP of 10  Last sleep study was moderate obstructive sleep apnea in 2007, has been using CPAP regularly  Recently started on Abilify for anxiety and for sleep issues  Was on Klonopin previously but was not working  History of chronic insomnia  PMH: Insomnia  Smoker/ Smoking History: Never Smoker Maintenance:  None  Pt of: Dr. Ander Slade  Specialty Problems       Pulmonary Problems   OSA (obstructive sleep apnea)   Asthma    Allergies  Allergen Reactions   Penicillins Rash    Immunization History  Administered Date(s) Administered   Fluad Quad(high Dose 65+) 02/20/2019, 02/10/2021   Hepatitis A 02/21/2013, 08/22/2013   Hepatitis B 02/21/2013, 03/21/2013, 06/13/2013   Influenza-Unspecified 02/18/2020   PFIZER(Purple Top)SARS-COV-2 Vaccination 06/23/2019, 07/16/2019, 03/04/2020, 11/10/2020   Hope Covid Bivalent Pediatric Vaccine(99mo to <537yr 11/06/2021   Pfizer Covid-19 Vaccine Bivalent Booster 1268yr up 02/10/2021   Pneumococcal Conjugate-13 01/14/2014   Pneumococcal Polysaccharide-23 05/11/2020   Tdap 09/21/2020   Zoster Recombinat (Shingrix) 01/30/2017, 04/01/2017    Past Medical History:  Diagnosis Date   Anxiety    Asthma    B[place] disorder ([place]  CAD (coronary artery disease)    MI 2007   Depression    [facility]  High cholesterol    Hypertension    Idiopathic urticaria    NASH (nonalcoholic steatohepatitis)    Osteoporosis    previosly treated with bonivas   Peripheral neuropathy    Plantar fasciitis    Sciatica    Thyroid disease     Tobacco History: Social History   Tobacco Use  Smoking Status Never    Passive exposure: Never  Smokeless Tobacco Never   Counseling given: Not Answered  Never smoker  Outpatient Encounter Medications as of 01/26/2022  Medication Sig   ARIPiprazole (ABILIFY) 2 MG tablet Take 1 tablet (2 mg total) by mouth daily.   aspirin EC 81 MG tablet Take 81 mg by mouth daily.   ezetimibe (ZETIA) 10 MG tablet Take 1 tablet (10 mg total) by mouth daily.   levothyroxine (SYNTHROID) 100 MCG tablet Take 1 tablet (100 mcg total) by mouth daily. (Patient taking differently: Take 125 mcg by mouth daily. Take 1/2 tablet 2 days weekly and  1 tablet after.)   metoprolol succinate (TOPROL-XL) 50 MG 24 hr tablet Take 1 tablet (50 mg total) by mouth daily.   Misc Natural Products (LUTEIN VISION BLEND PO) Take by mouth daily.   PREVIDENT 5000 BOOSTER PLUS 1.1 % PSTE Place onto teeth.   rosuvastatin (CRESTOR) 10 MG tablet Take 1 tablet (10 mg total) by mouth daily.   sertraline (ZOLOFT) 100 MG tablet Take 1 tablet (100 mg total) by mouth daily.   UNABLE TO FIND at bedtime. CPAP   nitroGLYCERIN (NITROSTAT) 0.4 MG SL tablet Place 1 tablet (0.4 mg total) under the tongue every 5 (five) minutes as needed for chest pain.   No facility-administered encounter medications on file as of 01/26/2022.   Review of Systems  Review of Systems  Constitutional:  Negative for activity change, fatigue and fever.  HENT:  Negative for sinus pressure, sinus pain and sore throat.  Respiratory:  Negative for cough, shortness of breath and wheezing.   Cardiovascular:  Negative for chest pain and palpitations.  Gastrointestinal:  Negative for diarrhea, nausea and vomiting.  Musculoskeletal:  Negative for arthralgias.  Neurological:  Negative for dizziness.  Psychiatric/Behavioral:  Negative for sleep disturbance. The patient is not nervous/anxious.     Physical Exam  BP 112/68 (BP Location: Left Arm, Cuff Size: Normal)   Pulse 70   Temp 98.1 F (36.7 C)   Ht 5' 1"  (1.549 m)   Wt 156 lb (70.8 kg)    SpO2 98%   BMI 29.48 kg/m   Wt Readings from Last 5 Encounters:  01/26/22 156 lb (70.8 kg)  01/20/22 154 lb 6 oz (70 kg)  07/20/21 158 lb 4 oz (71.8 kg)  06/07/21 162 lb (73.5 kg)  06/02/21 168 lb (76.2 kg)    BMI Readings from Last 5 Encounters:  01/26/22 29.48 kg/m  01/20/22 29.17 kg/m  07/20/21 29.90 kg/m  06/07/21 30.61 kg/m  06/02/21 31.74 kg/m   Physical Exam Vitals and nursing note reviewed.  Constitutional:      General: She is not in acute distress.    Appearance: Normal appearance.  HENT:     Mouth/Throat:     Mouth: Mucous membranes are moist.  Eyes:     General:        Right eye: No discharge.        Left eye: No discharge.  Cardiovascular:     Rate and Rhythm: Normal rate and regular rhythm.     Pulses: Normal pulses.     Heart sounds: Normal heart sounds. No murmur heard. Pulmonary:     Effort: Pulmonary effort is normal. No respiratory distress.     Breath sounds: Normal breath sounds. No stridor or decreased air movement. No decreased breath sounds, wheezing, rhonchi or rales.  Musculoskeletal:     Cervical back: No rigidity or tenderness.  Neurological:     Mental Status: She is alert.  Psychiatric:        Mood and Affect: Mood normal.   Compliance data reveals 48% compliance Did have a period with CPAP was not used but has been using it regularly recently Residual AHI of 3.2  Previous sleep study from 2007 did reveal moderate obstructive sleep apnea with severe number of events during REM sleep  Assessment & Plan:   Obstructive sleep apnea -She tolerated CPAP well -Current machine is becoming dysfunctional  Insomnia  Recently started on Abilify  Hypothyroidism History of asthma History of nonalcoholic steatohepatitis  Graded exercise as tolerated  Encouraged to continue CPAP nightly  Call with significant concerns  Plan: DME referral follow-up for CPAP 5-15  Continue using current machine until we are able to get a new  machine  You may need a new sleep study as the previous study was in 2007  Call us with significant concerns  Tentative follow-up in 3 to 4 months Jakhi Dishman Clearence Ped, MD 01/26/2022

## 2022-02-01 ENCOUNTER — Other Ambulatory Visit: Payer: Medicare Other

## 2022-02-01 DIAGNOSIS — G4733 Obstructive sleep apnea (adult) (pediatric): Secondary | ICD-10-CM | POA: Diagnosis not present

## 2022-02-13 ENCOUNTER — Ambulatory Visit
Admission: RE | Admit: 2022-02-13 | Discharge: 2022-02-13 | Disposition: A | Discharge status: Home / Self Care | Payer: Medicare Other | Source: Ambulatory Visit | Attending: Family Medicine | Admitting: Family Medicine

## 2022-02-13 DIAGNOSIS — R519 Headache, unspecified: Secondary | ICD-10-CM | POA: Diagnosis not present

## 2022-02-13 DIAGNOSIS — R202 Paresthesia of skin: Secondary | ICD-10-CM

## 2022-02-13 DIAGNOSIS — G4489 Other headache syndrome: Secondary | ICD-10-CM

## 2022-02-17 ENCOUNTER — Other Ambulatory Visit (INDEPENDENT_AMBULATORY_CARE_PROVIDER_SITE_OTHER): Payer: Medicare Other

## 2022-02-17 DIAGNOSIS — E538 Deficiency of other specified B group vitamins: Secondary | ICD-10-CM | POA: Diagnosis not present

## 2022-02-17 DIAGNOSIS — R7989 Other specified abnormal findings of blood chemistry: Secondary | ICD-10-CM

## 2022-02-17 DIAGNOSIS — E039 Hypothyroidism, unspecified: Secondary | ICD-10-CM

## 2022-02-17 LAB — HEPATIC FUNCTION PANEL
ALT: 49 U/L — ABNORMAL HIGH (ref 0–35)
AST: 51 U/L — ABNORMAL HIGH (ref 0–37)
Albumin: 4.5 g/dL (ref 3.5–5.2)
Alkaline Phosphatase: 59 U/L (ref 39–117)
Bilirubin, Direct: 0.2 mg/dL (ref 0.0–0.3)
Total Bilirubin: 0.9 mg/dL (ref 0.2–1.2)
Total Protein: 6.8 g/dL (ref 6.0–8.3)

## 2022-02-17 LAB — TSH: TSH: 0.03 u[IU]/mL — ABNORMAL LOW (ref 0.35–5.50)

## 2022-02-17 LAB — VITAMIN B12: Vitamin B-12: 568 pg/mL (ref 211–911)

## 2022-02-18 NOTE — Progress Notes (Signed)
We will discuss lab on 10/18 at your appointment  It was too early to do them, but thyroid dose still too high.

## 2022-02-23 ENCOUNTER — Ambulatory Visit (INDEPENDENT_AMBULATORY_CARE_PROVIDER_SITE_OTHER): Payer: Medicare Other | Admitting: Family Medicine

## 2022-02-23 ENCOUNTER — Encounter: Payer: Self-pay | Admitting: Family Medicine

## 2022-02-23 VITALS — BP 130/70 | HR 69 | Temp 97.4°F | Ht 61.0 in | Wt 154.8 lb

## 2022-02-23 DIAGNOSIS — R7989 Other specified abnormal findings of blood chemistry: Secondary | ICD-10-CM | POA: Diagnosis not present

## 2022-02-23 DIAGNOSIS — I10 Essential (primary) hypertension: Secondary | ICD-10-CM

## 2022-02-23 DIAGNOSIS — F5101 Primary insomnia: Secondary | ICD-10-CM

## 2022-02-23 DIAGNOSIS — E038 Other specified hypothyroidism: Secondary | ICD-10-CM | POA: Diagnosis not present

## 2022-02-23 MED ORDER — ARIPIPRAZOLE 2 MG PO TABS: 2.0000 mg | ORAL_TABLET | Freq: Every day | ORAL | 1 refills | Status: DC

## 2022-02-23 MED ORDER — METOPROLOL SUCCINATE ER 50 MG PO TB24: 75.0000 mg | ORAL_TABLET | Freq: Every day | ORAL | 3 refills | Status: DC

## 2022-02-23 NOTE — Patient Instructions (Signed)
Increase metoprolol to 25m/day(so 1.5 pills of the 50's).  And let me know in 2 wks, how doing.   Do labs in 1 month.

## 2022-02-23 NOTE — Progress Notes (Signed)
Subjective:     Patient ID: Rebecca Lutz, female    DOB: [birth year], 71 y.o.   MRN: [id number]  Chief Complaint  Patient presents with   Follow-up    Pt following up for bp and weirdness? Pt has questions about the brain scan.     HPI  HTN-Pt is on metoprolol 50 .  Bp's running  140-150/60-70.  No ha/dizziness/cp/palp/edema/cough/sob.  Lives on 7th floor apt.  Foods at [employer] home is salty.  More stress as moving to another apt.   Sensation of something "popping" in head.  No more episodes.  Occ HA still(chronic).  Occ balance-tends to veer to one side and if turns fast-feels "off".   Insomnia-not panicking about not sleeping but still not sleeping. Ok w/it.  Elevated lft-told gallstones but no symptoms.  Very rare ETOH Hypothyroidism-on 139m daily. Did better on branded.   There are no preventive care reminders to display for this patient.  Past Medical History:  Diagnosis Date   Anxiety    Asthma    [place] disorder [place]    CAD (coronary artery disease)    MI 2007   Depression    Heart attack ([place]    High cholesterol    Hypertension    Idiopathic urticaria    NASH (nonalcoholic steatohepatitis)    Osteoporosis    previosly treated with bonivas   Peripheral neuropathy    Plantar fasciitis    Sciatica    Thyroid disease     Past Surgical History:  Procedure Laterality Date   APPENDECTOMY     DILATION AND CURETTAGE OF UTERUS     idiopathic urticaria     LEG SURGERY Right 2020   poss achilles for plantar fasciitis   NASH     osteoporosis     TONSILLECTOMY      Outpatient Medications Prior to Visit  Medication Sig Dispense Refill   aspirin EC 81 MG tablet Take 81 mg by mouth daily.     ezetimibe (ZETIA) 10 MG tablet Take 1 tablet (10 mg total) by mouth daily. 90 tablet 3   levothyroxine (SYNTHROID) 100 MCG tablet Take 1 tablet (100 mcg total) by mouth daily. (Patient taking differently: Take 125 mcg by mouth daily. Take 1/2 tablet 2 days weekly and  1  tablet after.) 90 tablet 3   Misc Natural Products (LUTEIN VISION BLEND PO) Take by mouth daily.     PREVIDENT 5000 BOOSTER PLUS 1.1 % PSTE Place onto teeth.     rosuvastatin (CRESTOR) 10 MG tablet Take 1 tablet (10 mg total) by mouth daily. 90 tablet 1   sertraline (ZOLOFT) 100 MG tablet Take 1 tablet (100 mg total) by mouth daily. 90 tablet 1   UNABLE TO FIND at bedtime. CPAP     ARIPiprazole (ABILIFY) 2 MG tablet Take 1 tablet (2 mg total) by mouth daily. 30 tablet 1   metoprolol succinate (TOPROL-XL) 50 MG 24 hr tablet Take 1 tablet (50 mg total) by mouth daily. 90 tablet 3   nitroGLYCERIN (NITROSTAT) 0.4 MG SL tablet Place 1 tablet (0.4 mg total) under the tongue every 5 (five) minutes as needed for chest pain. 90 tablet 3   No facility-administered medications prior to visit.    Allergies  Allergen Reactions   Penicillins Rash   ROS neg/noncontributory except as noted HPI/below      Objective:     BP 130/70 (BP Location: Left Arm, Patient Position: Sitting, Cuff Size: Normal)   Pulse 69  Temp (!) 97.4 F (36.3 C) (Temporal)   Ht 5' 1"  (1.549 m)   Wt 154 lb 12.8 oz (70.2 kg)   SpO2 97%   BMI 29.25 kg/m  Wt Readings from Last 3 Encounters:  02/23/22 154 lb 12.8 oz (70.2 kg)  01/26/22 156 lb (70.8 kg)  01/20/22 154 lb 6 oz (70 kg)    Physical Exam   Gen: WDWN NAD HEENT: NCAT, conjunctiva not injected, sclera nonicteric NECK:  supple, no thyromegaly, no nodes, no carotid bruits CARDIAC: RRR, S1S2+, no murmur. DP 2+B LUNGS: CTAB. No wheezes ABDOMEN:  BS+, soft, NTND, No HSM, no masses EXT:  no edema MSK: no gross abnormalities.  NEURO: A&O x3.  CN II-XII intact.  PSYCH: normal mood. Good eye contact     Assessment & Plan:   Problem List Items Addressed This Visit       Cardiovascular and Mediastinum   Essential hypertension - Primary   Relevant Medications   metoprolol succinate (TOPROL-XL) 50 MG 24 hr tablet     Endocrine   Hypothyroidism   Relevant  Medications   metoprolol succinate (TOPROL-XL) 50 MG 24 hr tablet   Other Relevant Orders   TSH     Other   Insomnia   Other Visit Diagnoses     Elevated liver function tests       Relevant Orders   Hepatic function panel      HTN-chronic.  Not well controlled.  Increase metoprolol to 744m-monitor and keep u[place]oreaposted.  May decrease when settles into new apt as well Hypothyroidism-chonic.  Dose decreased to 100 <1744mogo so did labs too early.  Check TSH 1 mo Insomnia-some better on abiligy 44m65mt ok w/this .   Cont Elevated LFT's-has poss ho NASH.  Work on diet/exercise.  May be from statin as well.  Repeat LFT 1 mo.  Check u/s liver F/u [id number] ordered this encounter  Medications   ARIPiprazole (ABILIFY) 2 MG tablet    Sig: Take 1 tablet (2 mg total) by mouth daily.    Dispense:  90 tablet    Refill:  1   metoprolol succinate (TOPROL-XL) 50 MG 24 hr tablet    Sig: Take 1.5 tablets (75 mg total) by mouth daily.    Dispense:  135 tablet    Refill:  3    Jaaziel Peatross [place]

## 2022-02-25 IMAGING — DX DG FOOT COMPLETE 3+V*L*
3 series · 3 of 3 positions shown · non-contrast
Comparison: None.

CLINICAL DATA: Pain following fall

EXAM:
LEFT FOOT - COMPLETE 3+ VIEW

[foot ap]
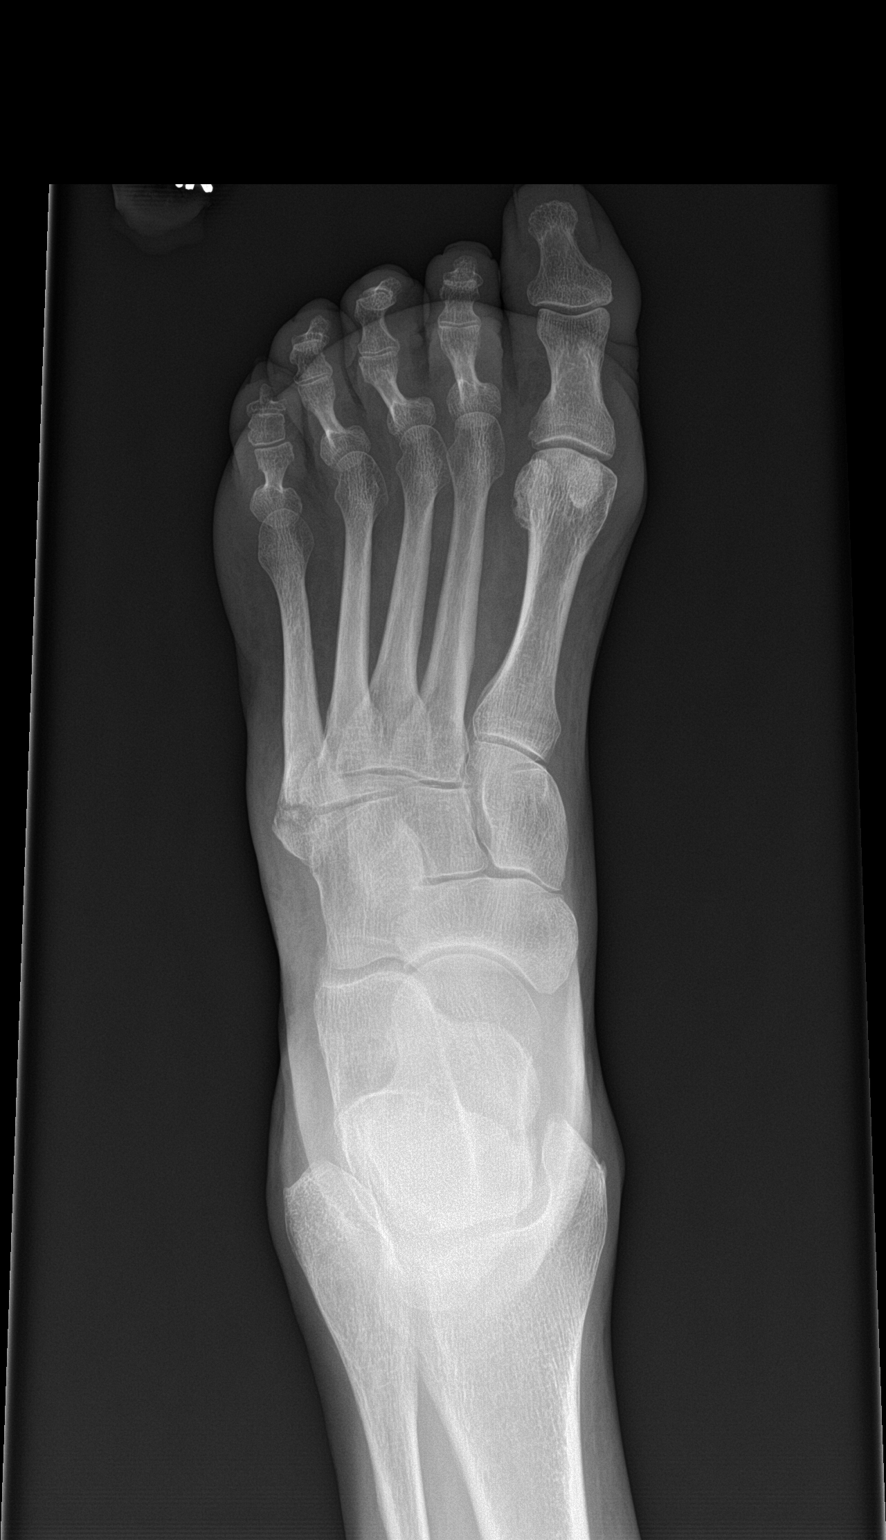

[foot obl]
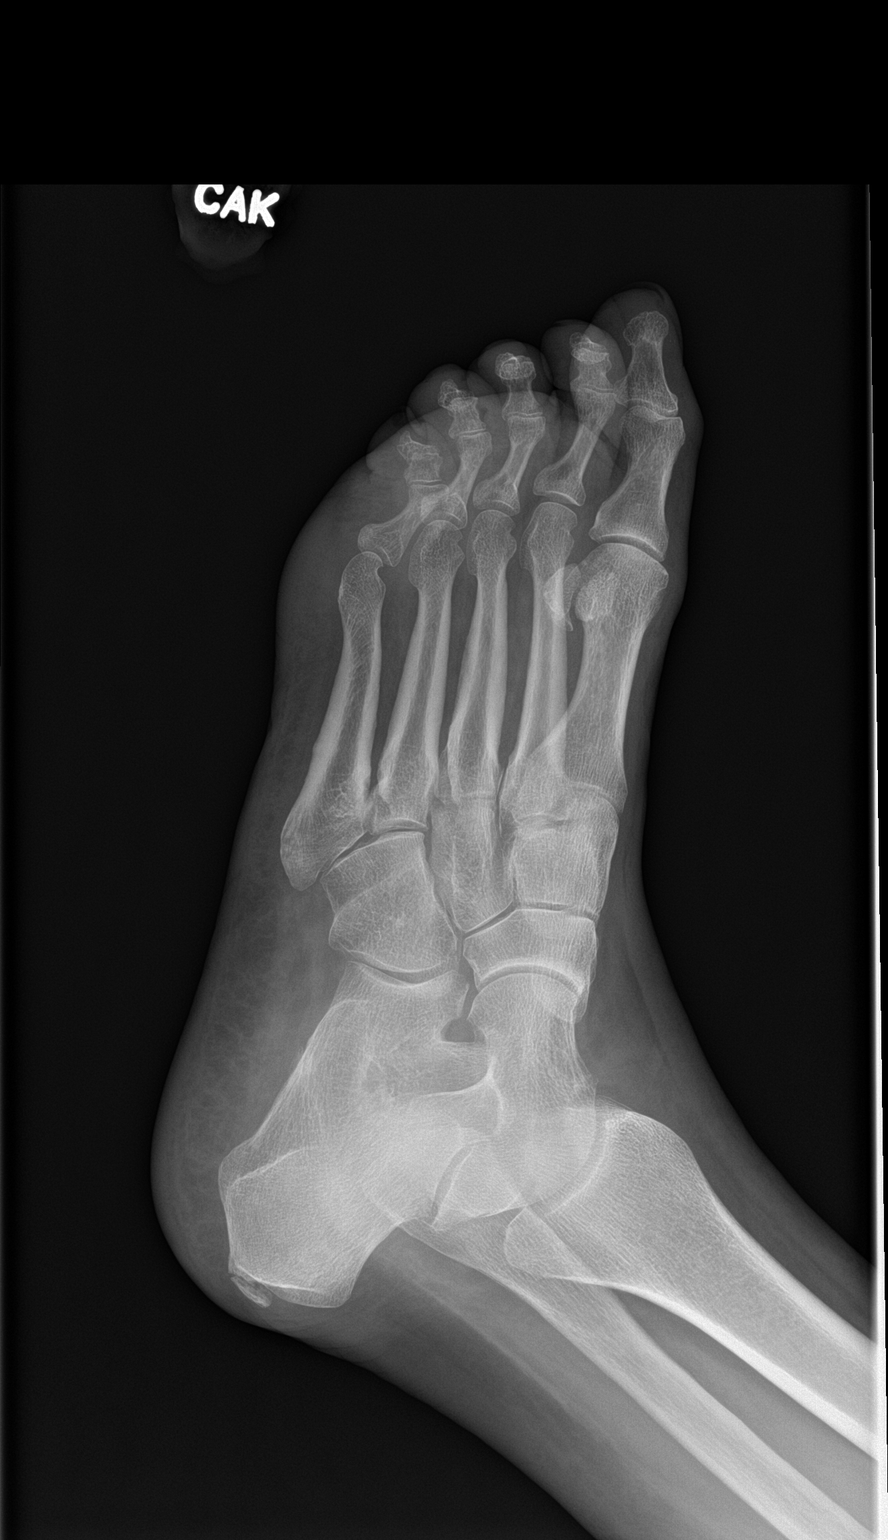

[foot lat]
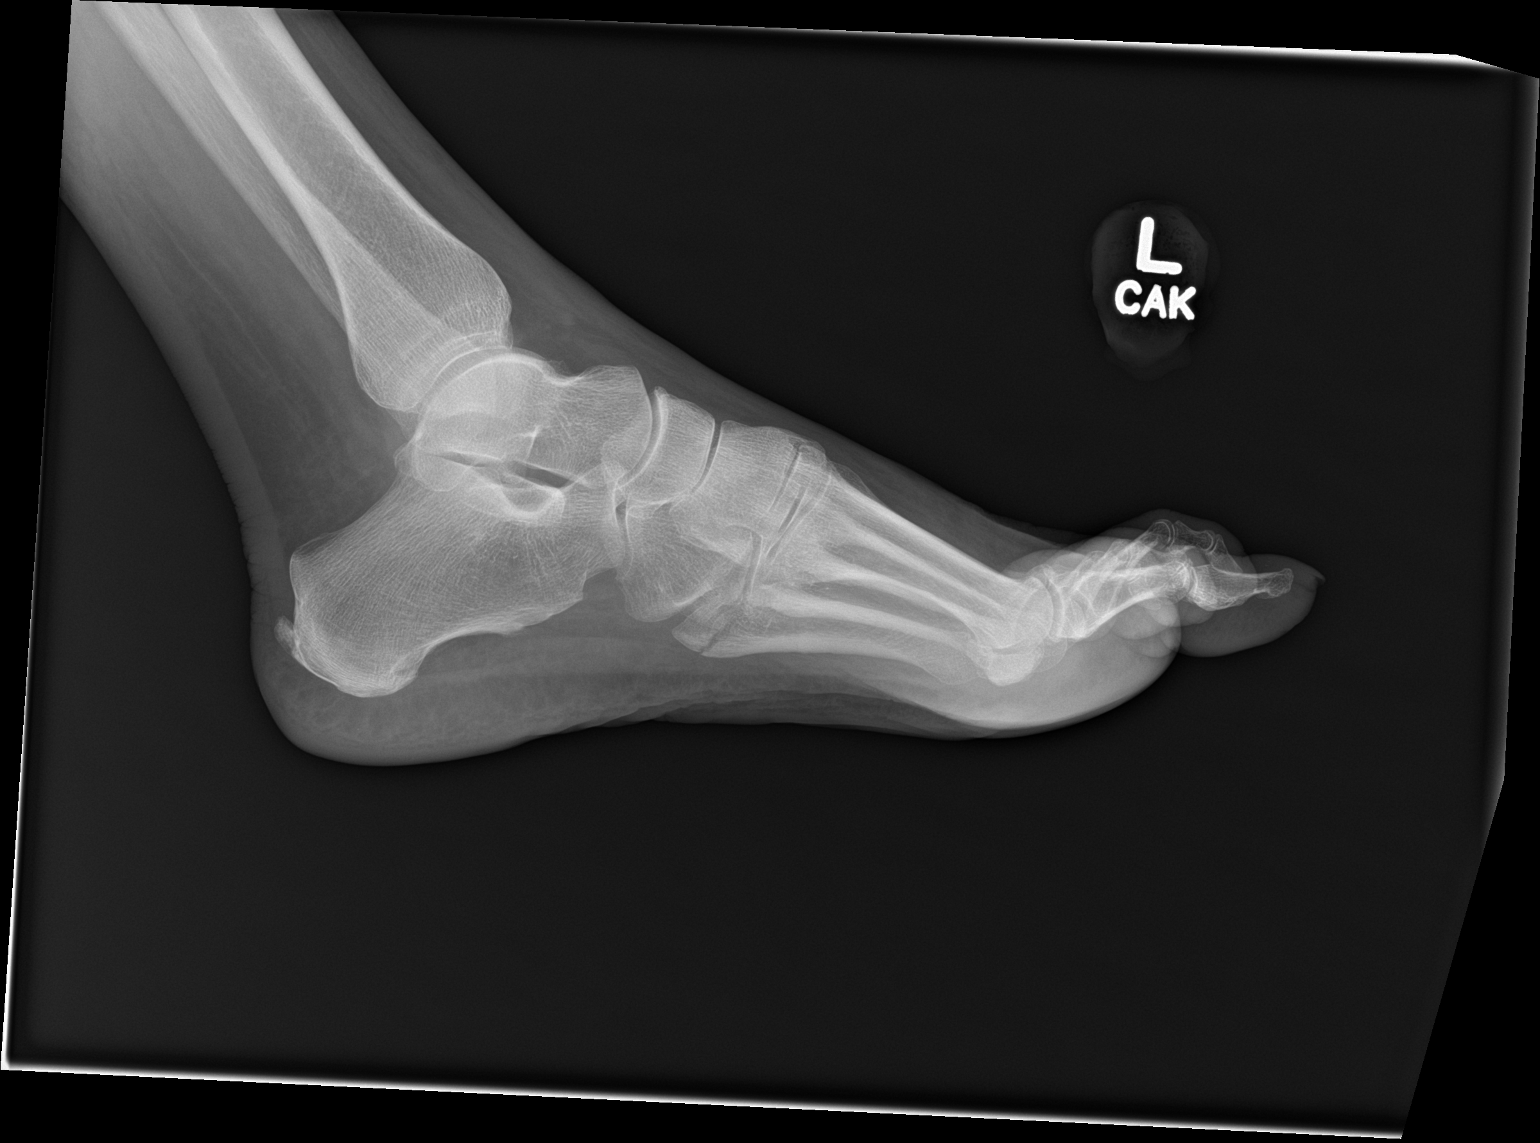

[3 of 3 positions shown; findings below may reference images not displayed]

FINDINGS: Frontal, oblique, and lateral views were obtained. There is a
fracture along the proximal aspect of the fifth metatarsal with
slight separation of fracture fragments. No other evident fracture.
No dislocation. The joint spaces appear normal. There is a posterior
calcaneal spur.
IMPRESSION: Fracture proximal fifth metatarsal with mild separation of fracture
fragments in this area. No other fracture. No dislocation. No joint
space narrowing. There is a posterior calcaneal spur.

## 2022-02-27 DIAGNOSIS — G4733 Obstructive sleep apnea (adult) (pediatric): Secondary | ICD-10-CM | POA: Diagnosis not present

## 2022-02-28 ENCOUNTER — Ambulatory Visit
Admission: RE | Admit: 2022-02-28 | Discharge: 2022-02-28 | Disposition: A | Discharge status: Home / Self Care | Payer: Medicare Other | Source: Ambulatory Visit | Attending: Family Medicine | Admitting: Family Medicine

## 2022-02-28 DIAGNOSIS — K760 Fatty (change of) liver, not elsewhere classified: Secondary | ICD-10-CM | POA: Diagnosis not present

## 2022-02-28 DIAGNOSIS — R945 Abnormal results of liver function studies: Secondary | ICD-10-CM | POA: Diagnosis not present

## 2022-02-28 DIAGNOSIS — K8020 Calculus of gallbladder without cholecystitis without obstruction: Secondary | ICD-10-CM | POA: Diagnosis not present

## 2022-02-28 DIAGNOSIS — R7989 Other specified abnormal findings of blood chemistry: Secondary | ICD-10-CM

## 2022-03-02 ENCOUNTER — Other Ambulatory Visit: Payer: Self-pay | Admitting: *Deleted

## 2022-03-02 DIAGNOSIS — R7989 Other specified abnormal findings of blood chemistry: Secondary | ICD-10-CM

## 2022-03-10 DIAGNOSIS — G4733 Obstructive sleep apnea (adult) (pediatric): Secondary | ICD-10-CM | POA: Diagnosis not present

## 2022-03-11 ENCOUNTER — Encounter: Payer: Self-pay | Admitting: Family Medicine

## 2022-03-14 ENCOUNTER — Other Ambulatory Visit: Payer: Self-pay | Admitting: Family Medicine

## 2022-03-14 DIAGNOSIS — Z1231 Encounter for screening mammogram for malignant neoplasm of breast: Secondary | ICD-10-CM

## 2022-03-23 ENCOUNTER — Other Ambulatory Visit (INDEPENDENT_AMBULATORY_CARE_PROVIDER_SITE_OTHER): Payer: Medicare Other

## 2022-03-23 DIAGNOSIS — R7989 Other specified abnormal findings of blood chemistry: Secondary | ICD-10-CM | POA: Diagnosis not present

## 2022-03-23 DIAGNOSIS — E038 Other specified hypothyroidism: Secondary | ICD-10-CM | POA: Diagnosis not present

## 2022-03-23 LAB — HEPATIC FUNCTION PANEL
ALT: 43 U/L — ABNORMAL HIGH (ref 0–35)
AST: 36 U/L (ref 0–37)
Albumin: 4.3 g/dL (ref 3.5–5.2)
Alkaline Phosphatase: 63 U/L (ref 39–117)
Bilirubin, Direct: 0.2 mg/dL (ref 0.0–0.3)
Total Bilirubin: 0.7 mg/dL (ref 0.2–1.2)
Total Protein: 6.2 g/dL (ref 6.0–8.3)

## 2022-03-23 LAB — TSH: TSH: 0.09 u[IU]/mL — ABNORMAL LOW (ref 0.35–5.50)

## 2022-03-23 NOTE — Progress Notes (Signed)
Liver stable.  Will continue to monitor.  Thyroid still way too high-please verify dose and how she is taking it so I know what to change it to.  Will need to repeat TSH in 2 months after change

## 2022-03-24 ENCOUNTER — Other Ambulatory Visit: Payer: Self-pay | Admitting: *Deleted

## 2022-03-24 DIAGNOSIS — R7989 Other specified abnormal findings of blood chemistry: Secondary | ICD-10-CM

## 2022-03-28 DIAGNOSIS — F3176 Bipolar disorder, in full remission, most recent episode depressed: Secondary | ICD-10-CM | POA: Diagnosis not present

## 2022-03-30 DIAGNOSIS — G4733 Obstructive sleep apnea (adult) (pediatric): Secondary | ICD-10-CM | POA: Diagnosis not present

## 2022-04-12 ENCOUNTER — Other Ambulatory Visit: Payer: Self-pay | Admitting: *Deleted

## 2022-04-12 MED ORDER — LEVOTHYROXINE SODIUM 75 MCG PO TABS: 75.0000 ug | ORAL_TABLET | Freq: Every day | ORAL | 0 refills | Status: DC

## 2022-05-05 ENCOUNTER — Telehealth: Payer: Medicare Other | Admitting: Physician Assistant

## 2022-05-05 ENCOUNTER — Telehealth: Payer: Self-pay | Admitting: Family Medicine

## 2022-05-05 DIAGNOSIS — U071 COVID-19: Secondary | ICD-10-CM

## 2022-05-05 MED ORDER — NIRMATRELVIR/RITONAVIR (PAXLOVID)TABLET: 3.0000 | ORAL_TABLET | Freq: Two times a day (BID) | ORAL | 0 refills | Status: DC

## 2022-05-05 NOTE — Telephone Encounter (Signed)
No openings in the office for either day. Patient set up with virtual urgent care today at 615 pm.

## 2022-05-05 NOTE — Patient Instructions (Signed)
Rebecca Lutz, thank you for joining Mar Daring, PA-C for today's virtual visit.  While this provider is not your primary care provider (PCP), if your PCP is located in our provider database this encounter information will be shared with them immediately following your visit.   [facility] account gives you access to today's visit and all your visits, tests, and labs performed at [facility] " click here if you don't have a [facility] account or go to [email or web address]  Consent: (Patient) Rebecca Lutz provided verbal consent for this virtual visit at the beginning of the encounter.  Current Medications:  Current Outpatient Medications:    nirmatrelvir/ritonavir (PAXLOVID) 20 x 150 MG & 10 x 100MG TABS, Take 3 tablets by mouth 2 (two) times daily for 5 days. (Take nirmatrelvir 150 mg two tablets twice daily for 5 days and ritonavir 100 mg one tablet twice daily for 5 days) Patient GFR is 81, Disp: 30 tablet, Rfl: 0   ARIPiprazole (ABILIFY) 2 MG tablet, Take 1 tablet (2 mg total) by mouth daily., Disp: 90 tablet, Rfl: 1   aspirin EC 81 MG tablet, Take 81 mg by mouth daily., Disp: , Rfl:    ezetimibe (ZETIA) 10 MG tablet, Take 1 tablet (10 mg total) by mouth daily., Disp: 90 tablet, Rfl: 3   levothyroxine (SYNTHROID) 75 MCG tablet, Take 1 tablet (75 mcg total) by mouth daily., Disp: 90 tablet, Rfl: 0   metoprolol succinate (TOPROL-XL) 50 MG 24 hr tablet, Take 1.5 tablets (75 mg total) by mouth daily., Disp: 135 tablet, Rfl: 3   Misc Natural Products (LUTEIN VISION BLEND PO), Take by mouth daily., Disp: , Rfl:    nitroGLYCERIN (NITROSTAT) 0.4 MG SL tablet, Place 1 tablet (0.4 mg total) under the tongue every 5 (five) minutes as needed for chest pain., Disp: 90 tablet, Rfl: 3   PREVIDENT 5000 BOOSTER PLUS 1.1 % PSTE, Place onto teeth., Disp: , Rfl:    rosuvastatin (CRESTOR) 10 MG tablet, Take 1 tablet (10 mg total) by mouth daily., Disp: 90  tablet, Rfl: 1   sertraline (ZOLOFT) 100 MG tablet, Take 1 tablet (100 mg total) by mouth daily., Disp: 90 tablet, Rfl: 1   UNABLE TO FIND, at bedtime. CPAP, Disp: , Rfl:    Medications ordered in this encounter:  Meds ordered this encounter  Medications   nirmatrelvir/ritonavir (PAXLOVID) 20 x 150 MG & 10 x 100MG TABS    Sig: Take 3 tablets by mouth 2 (two) times daily for 5 days. (Take nirmatrelvir 150 mg two tablets twice daily for 5 days and ritonavir 100 mg one tablet twice daily for 5 days) Patient GFR is 81    Dispense:  30 tablet    Refill:  0    Order Specific Question:   Supervising Provider    Answer:   Chase Picket [id number]     *If you need refills on other medications prior to your next appointment, please contact your pharmacy*  Follow-Up: Call back or seek an in-person evaluation if the symptoms worsen or if the condition fails to improve as anticipated.  [place] [phone]  Other Instructions  HOLD Rosuvastatin and Zetia  COVID-19 COVID-19, or coronavirus disease 2019, is an infection that is caused by a new (novel) coronavirus called SARS-CoV-2. COVID-19 can cause many symptoms. In some people, the virus may not cause any symptoms. In others, it may cause mild or severe symptoms. Some people with severe  infection develop severe disease. What are the causes? This illness is caused by a virus. The virus may be in the air as tiny specks of fluid (aerosols) or droplets, or it may be on surfaces. You may catch the virus by: Breathing in droplets from an infected person. Droplets can be spread by a person breathing, speaking, singing, coughing, or sneezing. Touching something, like a table or a doorknob, that has virus on it (is contaminated) and then touching your mouth, nose, or eyes. What increases the risk? Risk for infection: You are more likely to get infected with the COVID-19 virus if: You are within 6 ft (1.8 m) of a person with  COVID-19 for 15 minutes or longer. You are providing care for a person who is infected with COVID-19. You are in close personal contact with other people. Close personal contact includes hugging, kissing, or sharing eating or drinking utensils. Risk for serious illness caused by COVID-19: You are more likely to get seriously ill from the COVID-19 virus if: You have cancer. You have a long-term (chronic) disease, such as: Chronic lung disease. This includes pulmonary embolism, chronic obstructive pulmonary disease, and cystic fibrosis. Long-term disease that lowers your body's ability to fight infection (immunocompromise). Serious cardiac conditions, such as heart failure, coronary artery disease, or cardiomyopathy. Diabetes. Chronic kidney disease. Liver diseases. These include cirrhosis, nonalcoholic fatty liver disease, alcoholic liver disease, or autoimmune hepatitis. You have obesity. You are pregnant or were recently pregnant. You have sickle cell disease. What are the signs or symptoms? Symptoms of this condition can range from mild to severe. Symptoms may appear any time from 2 to 14 days after being exposed to the virus. They include: Fever or chills. Shortness of breath or trouble breathing. Feeling tired or very tired. Headaches, body aches, or muscle aches. Runny or stuffy nose, sneezing, coughing, or sore throat. New loss of taste or smell. This is rare. Some people may also have stomach problems, such as nausea, vomiting, or diarrhea. Other people may not have any symptoms of COVID-19. How is this diagnosed? This condition may be diagnosed by testing samples to check for the COVID-19 virus. The most common tests are the PCR test and the antigen test. Tests may be done in the lab or at home. They include: Using a swab to take a sample of fluid from the back of your nose and throat (nasopharyngeal fluid), from your nose, or from your throat. Testing a sample of saliva from  your mouth. Testing a sample of coughed-up mucus from your lungs (sputum). How is this treated? Treatment for COVID-19 infection depends on the severity of the condition. Mild symptoms can be managed at home with rest, fluids, and over-the-counter medicines. Serious symptoms may be treated in a hospital intensive care unit (ICU). Treatment in the ICU may include: Supplemental oxygen. Extra oxygen is given through a tube in the nose, a face mask, or a hood. Medicines. These may include: Antivirals, such as monoclonal antibodies. These help your body fight off certain viruses that can cause disease. Anti-inflammatories, such as corticosteroids. These reduce inflammation and suppress the immune system. Antithrombotics. These prevent or treat blood clots, if they develop. Convalescent plasma. This helps boost your immune system, if you have an underlying immunosuppressive condition or are getting immunosuppressive treatments. Prone positioning. This means you will lie on your stomach. This helps oxygen to get into your lungs. Infection control measures. If you are at risk for more serious illness caused by COVID-19, your health  care provider may prescribe two long-acting monoclonal antibodies, given together every 6 months. How is this prevented? To protect yourself: Use preventive medicine (pre-exposure prophylaxis). You may get pre-exposure prophylaxis if you have moderate or severe immunocompromise. Get vaccinated. Anyone 44 months old or older who meets guidelines can get a COVID-19 vaccine or vaccine series. This includes people who are pregnant or making breast milk (lactating). Get an added dose of COVID-19 vaccine after your first vaccine or vaccine series if you have moderate to severe immunocompromise. This applies if you have had a solid organ transplant or have been diagnosed with an immunocompromising condition. You should get the added dose 4 weeks after you got the first COVID-19  vaccine or vaccine series. If you get an mRNA vaccine, you will need a 3-dose primary series. If you get the J&J/Janssen vaccine, you will need a 2-dose primary series, with the second dose being an mRNA vaccine. Talk to your health care provider about getting experimental monoclonal antibodies. This treatment is approved under emergency use authorization to prevent severe illness before or after being exposed to the COVID-19 virus. You may be given monoclonal antibodies if: You have moderate or severe immunocompromise. This includes treatments that lower your immune response. People with immunocompromise may not develop protection against COVID-19 when they are vaccinated. You cannot be vaccinated. You may not get a vaccine if you have a severe allergic reaction to the vaccine or its components. You are not fully vaccinated. You are in a facility where COVID-19 is present and: Are in close contact with a person who is infected with the COVID-19 virus. Are at high risk of being exposed to the COVID-19 virus. You are at risk of illness from new variants of the COVID-19 virus. To protect others: If you have symptoms of COVID-19, take steps to prevent the virus from spreading to others. Stay home. Leave your house only to get medical care. Do not use public transit, if possible. Do not travel while you are sick. Wash your hands often with soap and water for at least 20 seconds. If soap and water are not available, use alcohol-based hand sanitizer. Make sure that all people in your household wash their hands well and often. Cough or sneeze into a tissue or your sleeve or elbow. Do not cough or sneeze into your hand or into the air. Where to find more information Centers for Disease Control and Prevention: CharmCourses.be World Health Organization: https://www.castaneda.info/ Get help right away if: You have trouble breathing. You have pain or pressure in your chest. You are  confused. You have bluish lips and fingernails. You have trouble waking from sleep. You have symptoms that get worse. These symptoms may be an emergency. Get help right away. Call 911. Do not wait to see if the symptoms will go away. Do not drive yourself to the hospital. Summary COVID-19 is an infection that is caused by a new coronavirus. Sometimes, there are no symptoms. Other times, symptoms range from mild to severe. Some people with a severe COVID-19 infection develop severe disease. The virus that causes COVID-19 can spread from person to person through droplets or aerosols from breathing, speaking, singing, coughing, or sneezing. Mild symptoms of COVID-19 can be managed at home with rest, fluids, and over-the-counter medicines. This information is not intended to replace advice given to you by your health care provider. Make sure you discuss any questions you have with your health care provider. Document Revised: 04/13/2021 Document Reviewed: 04/15/2021 Elsevier Patient Education  [place].    If you have been instructed to have an in-person evaluation today at a local Urgent Care facility, please use the link below. It will take you to a list of all of our available [facility], including address, phone number and hours of operation. Please do not delay care.  [facility]  If you or a family member do not have a primary care provider, use the link below to schedule a visit and establish care. When you choose a [facility] primary care physician or advanced practice provider, you gain a long-term partner in health. Find a Primary Care Provider  Learn more about [facility]'s in-office and virtual care options: [facility]

## 2022-05-05 NOTE — Progress Notes (Signed)
Virtual Visit Consent   Rebecca Lutz, you are scheduled for a virtual visit with a [facility] provider today. Just as with appointments in the office, your consent must be obtained to participate. Your consent will be active for this visit and any virtual visit you may have with one of our providers in the next 365 days. If you have a MyChart account, a copy of this consent can be sent to you electronically.  As this is a virtual visit, video technology does not allow for your provider to perform a traditional examination. This may limit your provider's ability to fully assess your condition. If your provider identifies any concerns that need to be evaluated in person or the need to arrange testing (such as labs, EKG, etc.), we will make arrangements to do so. Although advances in technology are sophisticated, we cannot ensure that it will always work on either your end or our end. If the connection with a video visit is poor, the visit may have to be switched to a telephone visit. With either a video or telephone visit, we are not always able to ensure that we have a secure connection.  By engaging in this virtual visit, you consent to the provision of healthcare and authorize for your insurance to be billed (if applicable) for the services provided during this visit. Depending on your insurance coverage, you may receive a charge related to this service.  I need to obtain your verbal consent now. Are you willing to proceed with your visit today? Rebecca Lutz has provided verbal consent on 05/05/2022 for a virtual visit (video or telephone). Rebecca Daring, PA-C  Date: 05/05/2022 6:29 PM  Virtual Visit via Video Note   I, Rebecca Lutz, connected with  Rebecca Lutz  ([id number], 1950-07-12) on 05/05/22 at  6:15 PM EST by a video-enabled telemedicine application and verified that I am speaking with the correct person using two identifiers.  Location: Patient: Virtual Visit  Location Patient: Home Provider: Virtual Visit Location Provider: Home Office   I discussed the limitations of evaluation and management by telemedicine and the availability of in person appointments. The patient expressed understanding and agreed to proceed.    History of Present Illness: Rebecca Lutz is a 71 y.o. who identifies as a female who was assigned female at birth, and is being seen today for Covid 73.  HPI: URI  This is a new problem. Episode onset: Tested positive for Covid 19 on at home test today, symptoms started yesterday. The maximum temperature recorded prior to her arrival was 101 - 101.9 F. The fever has been present for Less than 1 day. Associated symptoms include congestion, coughing, headaches, rhinorrhea, sinus pain and a sore throat. Pertinent negatives include no diarrhea, ear pain, nausea, plugged ear sensation or vomiting. Associated symptoms comments: Myalgias. Treatments tried: tylenol. The treatment provided no relief.     Problems:  Patient Active Problem List   Diagnosis Date Noted   Dysuria 02/11/2021   Plantar fasciitis    High cholesterol    Anxiety    Asthma    Bipolar disorder (HCC)    CAD (coronary artery disease)    Depression    Osteoporosis    Peripheral neuropathy    Thyroid disease    Sciatica    Hypothyroidism 11/08/2019   NASH (nonalcoholic steatohepatitis) 11/08/2019   Hyperlipidemia 11/08/2019   Essential hypertension 11/08/2019   Bipolar depression ([place]) 11/08/2019   Seasonal allergies 11/08/2019   Idiopathic  urticaria 11/08/2019   Nondisplaced fracture of fifth metatarsal bone, left foot, initial encounter for closed fracture 10/22/2019   OSA (obstructive sleep apnea) 09/18/2019   Insomnia 09/18/2019    Allergies:  Allergies  Allergen Reactions   Penicillins Rash   Medications:  Current Outpatient Medications:    nirmatrelvir/ritonavir (PAXLOVID) 20 x 150 MG & 10 x 100MG TABS, Take 3 tablets by mouth 2 (two) times  daily for 5 days. (Take nirmatrelvir 150 mg two tablets twice daily for 5 days and ritonavir 100 mg one tablet twice daily for 5 days) Patient GFR is 81, Disp: 30 tablet, Rfl: 0   ARIPiprazole (ABILIFY) 2 MG tablet, Take 1 tablet (2 mg total) by mouth daily., Disp: 90 tablet, Rfl: 1   aspirin EC 81 MG tablet, Take 81 mg by mouth daily., Disp: , Rfl:    ezetimibe (ZETIA) 10 MG tablet, Take 1 tablet (10 mg total) by mouth daily., Disp: 90 tablet, Rfl: 3   levothyroxine (SYNTHROID) 75 MCG tablet, Take 1 tablet (75 mcg total) by mouth daily., Disp: 90 tablet, Rfl: 0   metoprolol succinate (TOPROL-XL) 50 MG 24 hr tablet, Take 1.5 tablets (75 mg total) by mouth daily., Disp: 135 tablet, Rfl: 3   Misc Natural Products (LUTEIN VISION BLEND PO), Take by mouth daily., Disp: , Rfl:    nitroGLYCERIN (NITROSTAT) 0.4 MG SL tablet, Place 1 tablet (0.4 mg total) under the tongue every 5 (five) minutes as needed for chest pain., Disp: 90 tablet, Rfl: 3   PREVIDENT 5000 BOOSTER PLUS 1.1 % PSTE, Place onto teeth., Disp: , Rfl:    rosuvastatin (CRESTOR) 10 MG tablet, Take 1 tablet (10 mg total) by mouth daily., Disp: 90 tablet, Rfl: 1   sertraline (ZOLOFT) 100 MG tablet, Take 1 tablet (100 mg total) by mouth daily., Disp: 90 tablet, Rfl: 1   UNABLE TO FIND, at bedtime. CPAP, Disp: , Rfl:   Observations/Objective: Patient is well-developed, well-nourished in no acute distress.  Resting comfortably at home.  Head is normocephalic, atraumatic.  No labored breathing.  Speech is clear and coherent with logical content.  Patient is alert and oriented at baseline.    Assessment and Plan: 1. COVID-19 - MyChart COVID-19 home monitoring program; Future - nirmatrelvir/ritonavir (PAXLOVID) 20 x 150 MG & 10 x 100MG TABS; Take 3 tablets by mouth 2 (two) times daily for 5 days. (Take nirmatrelvir 150 mg two tablets twice daily for 5 days and ritonavir 100 mg one tablet twice daily for 5 days) Patient GFR is 81  Dispense: 30  tablet; Refill: 0  - Continue OTC symptomatic management of choice - Will send OTC vitamins and supplement information through AVS - Paxlovid prescribed; HOLD Rosuvastatin and Zetia - Patient enrolled in MyChart symptom monitoring - Push fluids - Rest as needed - Discussed return precautions and when to seek in-person evaluation, sent via AVS as well   Follow Up Instructions: I discussed the assessment and treatment plan with the patient. The patient was provided an opportunity to ask questions and all were answered. The patient agreed with the plan and demonstrated an understanding of the instructions.  A copy of instructions were sent to the patient via MyChart unless otherwise noted below.    The patient was advised to call back or seek an in-person evaluation if the symptoms worsen or if the condition fails to improve as anticipated.  Time:  I spent 12 minutes with the patient via telehealth technology discussing the above problems/concerns.  Rebecca Daring, PA-C

## 2022-05-05 NOTE — Telephone Encounter (Signed)
Pt states she is Covid positive and would like to now if she can have the medication for symptoms of body ache, sore throat, cough and congestion and fever. Please advise and call pt back with details.

## 2022-05-06 ENCOUNTER — Encounter: Payer: Self-pay | Admitting: Physician Assistant

## 2022-05-06 MED ORDER — NIRMATRELVIR/RITONAVIR (PAXLOVID)TABLET: 3.0000 | ORAL_TABLET | Freq: Two times a day (BID) | ORAL | 0 refills | Status: AC

## 2022-05-06 NOTE — Addendum Note (Signed)
Addended by: Mar Daring on: 05/06/2022 08:18 AM   Modules accepted: Orders

## 2022-05-25 ENCOUNTER — Telehealth: Payer: Self-pay | Admitting: Family Medicine

## 2022-05-25 NOTE — Telephone Encounter (Signed)
Please follow up with patient.

## 2022-05-25 NOTE — Telephone Encounter (Signed)
Noted  

## 2022-05-25 NOTE — Telephone Encounter (Signed)
Patient states: - Following OV w/ PCP on 02/23/22, she was meant to complete TSH lab recheck in 1 month - She was also set to follow up with PCP in 6 months; pt has been scheduled for 08/25/22.   Can patient go ahead and complete TSH blood work or should she wait until 6 month f/u in April? Please Advise.

## 2022-05-26 ENCOUNTER — Other Ambulatory Visit (INDEPENDENT_AMBULATORY_CARE_PROVIDER_SITE_OTHER): Payer: Self-pay

## 2022-05-26 DIAGNOSIS — R7989 Other specified abnormal findings of blood chemistry: Secondary | ICD-10-CM

## 2022-05-26 LAB — TSH: TSH: 3.1 u[IU]/mL (ref 0.35–5.50)

## 2022-05-26 NOTE — Progress Notes (Signed)
Better.  Continue same and will reck at appt in April

## 2022-06-06 ENCOUNTER — Ambulatory Visit: Payer: Medicare Other | Attending: Internal Medicine | Admitting: Internal Medicine

## 2022-06-06 ENCOUNTER — Encounter: Payer: Self-pay | Admitting: Internal Medicine

## 2022-06-06 VITALS — BP 142/80 | HR 58 | Ht 61.0 in | Wt 158.8 lb

## 2022-06-06 DIAGNOSIS — I25118 Atherosclerotic heart disease of native coronary artery with other forms of angina pectoris: Secondary | ICD-10-CM | POA: Diagnosis not present

## 2022-06-06 MED ORDER — AMLODIPINE BESYLATE 5 MG PO TABS: 5.0000 mg | ORAL_TABLET | Freq: Every day | ORAL | 3 refills | Status: DC

## 2022-06-06 NOTE — Patient Instructions (Signed)
Medication Instructions:  Your physician has recommended you make the following change in your medication:   -Start amlodipine (norvasc) 5mg  once daily.  *If you need a refill on your cardiac medications before your next appointment, please call your pharmacy*   Follow-Up: At [facility], you and your health needs are our priority.  As part of our continuing mission to provide you with exceptional heart care, we have created designated Provider Care Teams.  These Care Teams include your primary Cardiologist (physician) and Advanced Practice Providers (APPs -  Physician Assistants and Nurse Practitioners) who all work together to provide you with the care you need, when you need it.  We recommend signing up for the patient portal called "MyChart".  Sign up information is provided on this After Visit Summary.  MyChart is used to connect with patients for Virtual Visits (Telemedicine).  Patients are able to view lab/test results, encounter notes, upcoming appointments, etc.  Non-urgent messages can be sent to your provider as well.   To learn more about what you can do with MyChart, go to [email or web address].    Your next appointment:   12 month(s)  Provider:   Phineas Inches, MD

## 2022-06-06 NOTE — Progress Notes (Signed)
Cardiology Office Note:    Date:  06/06/2022   ID:  Rebecca, Lutz 11-22-1950, MRN [id number]  PCP:  Rebecca Crook, MD   [facility] Providers Cardiologist:  None     Referring MD: Rebecca Crook, MD   No chief complaint on file. HTN  History of Present Illness:    Rebecca Lutz is a 72 y.o. female with a hx of MI in 2007 with normal CORS, CAC with score 89, 69th percentile, HTN, who follows with Rebecca Lutz.   She lives in [facility]. 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 She walks up and down the stairs. She is working on finding classes that she might like. She works outside when the weather is nice. She is asymptomatic. Her PCP is Rebecca Lutz. She has had a recent increase in her BP. She thinks the salt is too high at [facility]. They have a lot of processed food.  She is on metop succinate XL 50 mg daily  In terms of her MI in 2007, she was having a significant argument with an employee. Her CORS were not significantly obstructive. No hx of CHF. Recent CAC 2022 is < 75th percentile  Past Medical History:  Diagnosis Date   Anxiety    Asthma    Bipolar disorder ([place])    CAD (coronary artery disease)    MI 2007   Depression    Heart attack ([place])    High cholesterol    Hypertension    Idiopathic urticaria    NASH (nonalcoholic steatohepatitis)    Osteoporosis    previosly treated with bonivas   Peripheral neuropathy    Plantar fasciitis    Sciatica    Thyroid disease     Past Surgical History:  Procedure Laterality Date   APPENDECTOMY     DILATION AND CURETTAGE OF UTERUS     idiopathic urticaria     LEG SURGERY Right 2020   poss achilles for plantar fasciitis   NASH     osteoporosis     TONSILLECTOMY      Current Medications: Current Outpatient Medications on File Prior to Visit  Medication Sig Dispense Refill   ARIPiprazole (ABILIFY) 2 MG tablet Take 1 tablet (2 mg total) by mouth daily. 90 tablet 1   aspirin EC 81 MG tablet Take  81 mg by mouth daily.     ezetimibe (ZETIA) 10 MG tablet Take 1 tablet (10 mg total) by mouth daily. 90 tablet 3   levothyroxine (SYNTHROID) 75 MCG tablet Take 1 tablet (75 mcg total) by mouth daily. 90 tablet 0   metoprolol succinate (TOPROL-XL) 50 MG 24 hr tablet Take 1.5 tablets (75 mg total) by mouth daily. 135 tablet 3   Misc Natural Products (LUTEIN VISION BLEND PO) Take by mouth daily.     PREVIDENT 5000 BOOSTER PLUS 1.1 % PSTE Place onto teeth.     rosuvastatin (CRESTOR) 10 MG tablet Take 1 tablet (10 mg total) by mouth daily. 90 tablet 1   sertraline (ZOLOFT) 100 MG tablet Take 1 tablet (100 mg total) by mouth daily. 90 tablet 1   UNABLE TO FIND at bedtime. CPAP     No current facility-administered medications on file prior to visit.    Allergies:   Penicillins   Social History   Socioeconomic History   Marital status: Widowed    Spouse name: Not on file   Number of children: 3   Years of education: Not on file  Highest education level: Not on file  Occupational History   Not on file  Tobacco Use   Smoking status: Never    Passive exposure: Never   Smokeless tobacco: Never  Vaping Use   Vaping Use: Never used  Substance and Sexual Activity   Alcohol use: Yes    Comment: occasional   Drug use: Never   Sexual activity: Not Currently  Other Topics Concern   Not on file  Social History Narrative   2 sons, 1 daughter (all in [place])-5 grands   Widowed 2020   Retired- from [employer]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   Completed some college   Enjoys yard work, spending time with her grandchildren, painting, coloring and needlepoint   Lives in independent living at [place] Strain: Low Risk  (06/07/2021)   Overall Financial Resource Strain (CARDIA)    Difficulty of Paying Living Expenses: Not hard at all  Food Insecurity: No Food Insecurity (06/07/2021)   Hunger Vital Sign    Worried About  Running Out of Food in the Last Year: Never true    [place] in the Last Year: Never true  Transportation Needs: No Transportation Needs (06/07/2021)   PRAPARE - Hydrologist (Medical): No    Lack of Transportation (Non-Medical): No  Physical Activity: Insufficiently Active (06/07/2021)   Exercise Vital Sign    Days of Exercise per Week: 2 days    Minutes of Exercise per Session: 60 min  Stress: No Stress Concern Present (06/07/2021)   [place]    Feeling of Stress : Not at all  Social Connections: Moderately Isolated (06/07/2021)   Social Connection and Isolation Panel [NHANES]    Frequency of Communication with Friends and Family: More than three times a week    Frequency of Social Gatherings with Friends and Family: More than three times a week    Attends Religious Services: More than 4 times per year    Active Member of [employer] or Organizations: No    Attends Archivist Meetings: Never    Marital Status: Widowed     Family History: The patient's family history includes COPD in her mother; Cancer in her daughter; Depression in her father; Heart attack in her mother; Heart failure in her mother; Lung cancer in her mother; Suicidality in her father.  ROS:   Please see the history of present illness.     All other systems reviewed and are negative.  EKGs/Labs/Other Studies Reviewed:    The following studies were reviewed today:   EKG:  EKG is  ordered today.  The ekg ordered today demonstrates   06/06/2022- sinus bradycardia HR 58 bpm  Recent Labs: 01/20/2022: BUN 14; Creatinine, Ser 0.73; Hemoglobin 12.4; Platelets 141.0; Potassium 4.0; Sodium 140 03/23/2022: ALT 43 05/26/2022: TSH 3.10   Recent Lipid Panel    Component Value Date/Time   CHOL 124 07/20/2021 0913   TRIG 126.0 07/20/2021 0913   HDL 49.80 07/20/2021 0913   CHOLHDL 2 07/20/2021 0913   VLDL 25.2  07/20/2021 0913   LDLCALC 49 07/20/2021 0913   LDLDIRECT 75.0 08/10/2020 0846     Risk Assessment/Calculations:     Physical Exam:    VS:   Vitals:   06/06/22 0848  BP: (!) 142/80  Pulse: (!) 58  SpO2: 97%  Wt Readings from Last 3 Encounters:  02/23/22 154 lb 12.8 oz (70.2 kg)  01/26/22 156 lb (70.8 kg)  01/20/22 154 lb 6 oz (70 kg)     GEN:  Well nourished, well developed in no acute distress HEENT: Normal NECK: No JVD; No carotid bruits LYMPHATICS: No lymphadenopathy CARDIAC: RRR, no murmurs, rubs, gallops RESPIRATORY:  Clear to auscultation without rales, wheezing or rhonchi  ABDOMEN: Soft, non-tender, non-distended MUSCULOSKELETAL:  No edema; No deformity  SKIN: Warm and dry NEUROLOGIC:  Alert and oriented x 3 PSYCHIATRIC:  Normal affect   ASSESSMENT:    CAD: reported hx of MI 2007. But had normal CORS. ?MINOCA. 69th percentile which is very low risk, <75th percentile for age.  No echo in the system -continue asa 81 mg daily -continue crestor 10 mg daily (LDL 49 mg/dL 5/95/6387)  HTN: mildly elevated. Will start norvasc 5 mg daily. Can continue metop 50 XL. Can continue to titrate with her PCP. BP goal < 130/80 mmHg PLAN:    In order of problems listed above:  Start Norvasc 5 mg daily Follow up 12 months      Medication Adjustments/Labs and Tests Ordered: Current medicines are reviewed at length with the patient today.  Concerns regarding medicines are outlined above.  No orders of the defined types were placed in this encounter.  No orders of the defined types were placed in this encounter.   There are no Patient Instructions on file for this visit.   Signed, Maisie Fus, MD  06/06/2022 7:44 AM    [facility]

## 2022-06-20 ENCOUNTER — Ambulatory Visit
Admission: RE | Admit: 2022-06-20 | Discharge: 2022-06-20 | Disposition: A | Discharge status: Home / Self Care | Payer: Medicare Other | Source: Ambulatory Visit

## 2022-06-20 DIAGNOSIS — Z1231 Encounter for screening mammogram for malignant neoplasm of breast: Secondary | ICD-10-CM

## 2022-06-20 LAB — HM MAMMOGRAPHY

## 2022-06-23 ENCOUNTER — Encounter: Payer: Self-pay | Admitting: *Deleted

## 2022-06-28 ENCOUNTER — Telehealth: Payer: Self-pay | Admitting: Family Medicine

## 2022-06-28 NOTE — Telephone Encounter (Signed)
Called patient to schedule Medicare Annual Wellness Visit (AWV). Left message for patient to call back and schedule Medicare Annual Wellness Visit (AWV).  Last date of AWV: 06/07/2021  Please schedule an appointment at any time with Otila Kluver, [facility].  If any questions, please contact me at [phone].  Thank you ,  Shaune Pollack [facility] AWV TEAM Direct Dial [phone]

## 2022-07-05 ENCOUNTER — Other Ambulatory Visit: Payer: Self-pay | Admitting: Family Medicine

## 2022-08-25 ENCOUNTER — Encounter: Payer: Self-pay | Admitting: Family Medicine

## 2022-08-25 ENCOUNTER — Ambulatory Visit (INDEPENDENT_AMBULATORY_CARE_PROVIDER_SITE_OTHER): Payer: Medicare Other | Admitting: Family Medicine

## 2022-08-25 VITALS — BP 114/70 | HR 61 | Temp 97.5°F | Ht 61.0 in | Wt 165.1 lb

## 2022-08-25 DIAGNOSIS — E038 Other specified hypothyroidism: Secondary | ICD-10-CM

## 2022-08-25 DIAGNOSIS — F3178 Bipolar disorder, in full remission, most recent episode mixed: Secondary | ICD-10-CM

## 2022-08-25 DIAGNOSIS — E785 Hyperlipidemia, unspecified: Secondary | ICD-10-CM | POA: Diagnosis not present

## 2022-08-25 DIAGNOSIS — I10 Essential (primary) hypertension: Secondary | ICD-10-CM | POA: Diagnosis not present

## 2022-08-25 DIAGNOSIS — K7581 Nonalcoholic steatohepatitis (NASH): Secondary | ICD-10-CM

## 2022-08-25 LAB — COMPREHENSIVE METABOLIC PANEL
ALT: 50 U/L — ABNORMAL HIGH (ref 0–35)
AST: 46 U/L — ABNORMAL HIGH (ref 0–37)
Albumin: 4.7 g/dL (ref 3.5–5.2)
Alkaline Phosphatase: 60 U/L (ref 39–117)
BUN: 15 mg/dL (ref 6–23)
CO2: 28 mEq/L (ref 19–32)
Calcium: 10 mg/dL (ref 8.4–10.5)
Chloride: 106 mEq/L (ref 96–112)
Creatinine, Ser: 0.76 mg/dL (ref 0.40–1.20)
GFR: 77.57 mL/min (ref >60.00)
Glucose, Bld: 93 mg/dL (ref 70–99)
Potassium: 4.3 mEq/L (ref 3.5–5.1)
Sodium: 141 mEq/L (ref 135–145)
Total Bilirubin: 0.8 mg/dL (ref 0.2–1.2)
Total Protein: 6.6 g/dL (ref 6.0–8.3)

## 2022-08-25 LAB — CBC WITH DIFFERENTIAL/PLATELET
Basophils Absolute: 0 10*3/uL (ref 0.0–0.1)
Basophils Relative: 0.9 % (ref 0.0–3.0)
Eosinophils Absolute: 0.3 10*3/uL (ref 0.0–0.7)
Eosinophils Relative: 6.6 % — ABNORMAL HIGH (ref 0.0–5.0)
HCT: 36.6 % (ref 36.0–46.0)
Hemoglobin: 12.9 g/dL (ref 12.0–15.0)
Lymphocytes Relative: 27.2 % (ref 12.0–46.0)
Lymphs Abs: 1.4 10*3/uL (ref 0.7–4.0)
MCHC: 35.2 g/dL (ref 30.0–36.0)
MCV: 88.4 fl (ref 78.0–100.0)
Monocytes Absolute: 0.3 10*3/uL (ref 0.1–1.0)
Monocytes Relative: 6.9 % (ref 3.0–12.0)
Neutro Abs: 2.9 10*3/uL (ref 1.4–7.7)
Neutrophils Relative %: 58.4 % (ref 43.0–77.0)
Platelets: 155 10*3/uL (ref 150.0–400.0)
RBC: 4.14 Mil/uL (ref 3.87–5.11)
RDW: 13.5 % (ref 11.5–15.5)
WBC: 5 10*3/uL (ref 4.0–10.5)

## 2022-08-25 LAB — LIPID PANEL
Cholesterol: 138 mg/dL (ref 0–200)
HDL: 54.2 mg/dL (ref >39.00)
LDL Cholesterol: 54 mg/dL (ref 0–99)
NonHDL: 83.32
Total CHOL/HDL Ratio: 3
Triglycerides: 145 mg/dL (ref 0.0–149.0)
VLDL: 29 mg/dL (ref 0.0–40.0)

## 2022-08-25 LAB — HEMOGLOBIN A1C: Hgb A1c MFr Bld: 5.3 % (ref 4.6–6.5)

## 2022-08-25 LAB — TSH: TSH: 14.61 u[IU]/mL — ABNORMAL HIGH (ref 0.35–5.50)

## 2022-08-25 MED ORDER — ROSUVASTATIN CALCIUM 10 MG PO TABS: 10.0000 mg | ORAL_TABLET | Freq: Every day | ORAL | 1 refills | Status: DC

## 2022-08-25 MED ORDER — SERTRALINE HCL 100 MG PO TABS: 100.0000 mg | ORAL_TABLET | Freq: Every day | ORAL | 1 refills | Status: DC

## 2022-08-25 MED ORDER — ARIPIPRAZOLE 2 MG PO TABS: 2.0000 mg | ORAL_TABLET | Freq: Every day | ORAL | 1 refills | Status: DC

## 2022-08-25 MED ORDER — EZETIMIBE 10 MG PO TABS: 10.0000 mg | ORAL_TABLET | Freq: Every day | ORAL | 3 refills | Status: DC

## 2022-08-25 MED ORDER — LEVOTHYROXINE SODIUM 75 MCG PO TABS: 75.0000 ug | ORAL_TABLET | Freq: Every day | ORAL | 0 refills | Status: DC

## 2022-08-25 NOTE — Progress Notes (Signed)
Stable labs except thyroid is crazy again.  Increase back up to 100 (send in 90/1) recheck TSH in 2 months

## 2022-08-25 NOTE — Assessment & Plan Note (Signed)
Chronic. Work on diet/exercise.  Check lft's

## 2022-08-25 NOTE — Assessment & Plan Note (Signed)
Chronic. Controlled.  Continue Abilify 2 mg and Zoloft 100 mg. Check A1C

## 2022-08-25 NOTE — Assessment & Plan Note (Signed)
Chronic.  Controlled.  Continue crestor 10 mg and zetia 10 mg.  Check CMP,lipids

## 2022-08-25 NOTE — Progress Notes (Signed)
Subjective:     Patient ID: Rebecca Lutz, female    DOB: November 19, 1950, 72 y.o.   MRN: [id number]  Chief Complaint  Patient presents with   Medical Management of Chronic Issues    6 month follow-up Fasting     HPI  HYPERTENSION-Pt is on metoprolol 75,amlodipine 5.  Bp's running 120/70.  No ha/dizziness/cp/palp/edema/cough/sob  Elevated LFT-not working on diet/exercise. Hypothyroidism-on .  Taking correctly. No complaints  HLD-on zetia and crestor 10-doing well Bipolar-on Abilify and Zoloft.  Doing well.  No SI.  Sleep fair-nothing new.  Health Maintenance Due  Topic Date Due   Medicare Annual Wellness (AWV)  06/07/2022    Past Medical History:  Diagnosis Date   Anxiety    Asthma    Bipolar disorder    CAD (coronary artery disease)    MI 2007   Depression    Heart attack    High cholesterol    Hypertension    Idiopathic urticaria    NASH (nonalcoholic steatohepatitis)    Osteoporosis    previosly treated with bonivas   Peripheral neuropathy    Plantar fasciitis    Sciatica    Thyroid disease     Past Surgical History:  Procedure Laterality Date   APPENDECTOMY     DILATION AND CURETTAGE OF UTERUS     idiopathic urticaria     LEG SURGERY Right 2020   poss achilles for plantar fasciitis   NASH     osteoporosis     TONSILLECTOMY       Current Outpatient Medications:    amLODipine (NORVASC) 5 MG tablet, Take 1 tablet (5 mg total) by mouth daily., Disp: 90 tablet, Rfl: 3   aspirin EC 81 MG tablet, Take 81 mg by mouth daily., Disp: , Rfl:    metoprolol succinate (TOPROL-XL) 50 MG 24 hr tablet, Take 1.5 tablets (75 mg total) by mouth daily., Disp: 135 tablet, Rfl: 3   Misc Natural Products (LUTEIN VISION BLEND PO), Take by mouth daily., Disp: , Rfl:    PREVIDENT 5000 BOOSTER PLUS 1.1 % PSTE, Place onto teeth., Disp: , Rfl:    UNABLE TO FIND, at bedtime. CPAP, Disp: , Rfl:    ARIPiprazole (ABILIFY) 2 MG tablet, Take 1 tablet (2 mg total) by mouth  daily., Disp: 90 tablet, Rfl: 1   ezetimibe (ZETIA) 10 MG tablet, Take 1 tablet (10 mg total) by mouth daily., Disp: 90 tablet, Rfl: 3   levothyroxine (SYNTHROID) 75 MCG tablet, Take 1 tablet (75 mcg total) by mouth daily., Disp: 90 tablet, Rfl: 0   rosuvastatin (CRESTOR) 10 MG tablet, Take 1 tablet (10 mg total) by mouth daily., Disp: 90 tablet, Rfl: 1   sertraline (ZOLOFT) 100 MG tablet, Take 1 tablet (100 mg total) by mouth daily., Disp: 90 tablet, Rfl: 1  Allergies  Allergen Reactions   Penicillins Rash   ROS neg/noncontributory except as noted HPI/below Eyes puffy in am for several months. Does wear CPAP.      Objective:     BP 114/70   Pulse 61   Temp (!) 97.5 F (36.4 C) (Temporal)   Ht  (1.549 m)   Wt 165 lb 2 oz (74.9 kg)   SpO2 96%   BMI 31.20 kg/m  Wt Readings from Last 3 Encounters:  08/25/22 165 lb 2 oz (74.9 kg)  06/06/22 158 lb 12.8 oz (72 kg)  02/23/22 154 lb 12.8 oz (70.2 kg)    Physical Exam   Gen: WDWN  NAD HEENT: NCAT, conjunctiva not injected, sclera nonicteric NECK:  supple, no thyromegaly, no nodes, no carotid bruits CARDIAC: RRR, S1S2+, no murmur. DP 2+B LUNGS: CTAB. No wheezes ABDOMEN:  BS+, soft, NTND, No HSM, no masses EXT:  no edema MSK: no gross abnormalities.  NEURO: A&O x3.  CN II-XII intact.  PSYCH: normal mood. Good eye contact     Assessment & Plan:  Essential hypertension -     CBC with Differential/Platelet -     Comprehensive metabolic panel  Other specified hypothyroidism -     TSH  Hyperlipidemia, unspecified hyperlipidemia type -     Hemoglobin A1c -     Comprehensive metabolic panel -     Lipid panel  NASH (nonalcoholic steatohepatitis) -     CBC with Differential/Platelet -     Hemoglobin A1c  Bipolar disorder, in full remission, most recent episode mixed  Other orders -     ARIPiprazole; Take 1 tablet (2 mg total) by mouth daily.  Dispense: 90 tablet; Refill: 1 -     Ezetimibe; Take 1 tablet (10 mg  total) by mouth daily.  Dispense: 90 tablet; Refill: 3 -     Levothyroxine Sodium; Take 1 tablet (75 mcg total) by mouth daily.  Dispense: 90 tablet; Refill: 0 -     Rosuvastatin Calcium; Take 1 tablet (10 mg total) by mouth daily.  Dispense: 90 tablet; Refill: 1 -     Sertraline HCl; Take 1 tablet (100 mg total) by mouth daily.  Dispense: 90 tablet; Refill: 1    Rebecca Sole, MD

## 2022-08-25 NOTE — Assessment & Plan Note (Signed)
Chronic.  Controlled.  Continue amlodipine 5 mg and metoprolol 75 mg.  Check CBC,CMP,A1C

## 2022-08-25 NOTE — Assessment & Plan Note (Signed)
Chronic.  Controlled.  Continue synthroid .  Check TSH

## 2022-08-25 NOTE — Patient Instructions (Signed)
It was very nice to see you today!  Get exercise   PLEASE NOTE:  If you had any lab tests please let us know if you have not heard back within a few days. You may see your results on MyChart before we have a chance to review them but we will give you a call once they are reviewed by us. If we ordered any referrals today, please let us know if you have not heard from their office within the next week.   Please try these tips to maintain a healthy lifestyle:  Eat most of your calories during the day when you are active. Eliminate processed foods including packaged sweets (pies, cakes, cookies), reduce intake of potatoes, white bread, white pasta, and white rice. Look for whole grain options, oat flour or almond flour.  Each meal should contain half fruits/vegetables, one quarter protein, and one quarter carbs (no bigger than a computer mouse).  Cut down on sweet beverages. This includes juice, soda, and sweet tea. Also watch fruit intake, though this is a healthier sweet option, it still contains natural sugar! Limit to 3 servings daily.  Drink at least 1 glass of water with each meal and aim for at least 8 glasses per day  Exercise at least 150 minutes every week.   

## 2022-08-26 ENCOUNTER — Telehealth: Payer: Self-pay

## 2022-08-26 DIAGNOSIS — E038 Other specified hypothyroidism: Secondary | ICD-10-CM

## 2022-08-26 MED ORDER — LEVOTHYROXINE SODIUM 100 MCG PO TABS: 100.0000 ug | ORAL_TABLET | Freq: Every day | ORAL | 1 refills | Status: DC

## 2022-08-26 NOTE — Telephone Encounter (Signed)
-----   Message from Jeani Sow, MD sent at 08/25/2022  8:39 PM EDT ----- Stable labs except thyroid is crazy again.  Increase back up to 100 (send in 90/1) recheck TSH in 2 months

## 2022-09-08 DIAGNOSIS — G4733 Obstructive sleep apnea (adult) (pediatric): Secondary | ICD-10-CM | POA: Diagnosis not present

## 2022-10-13 ENCOUNTER — Ambulatory Visit (INDEPENDENT_AMBULATORY_CARE_PROVIDER_SITE_OTHER): Payer: Medicare Other

## 2022-10-13 VITALS — Wt 165.0 lb

## 2022-10-13 DIAGNOSIS — Z0000 Encounter for general adult medical examination without abnormal findings: Secondary | ICD-10-CM | POA: Diagnosis not present

## 2022-10-13 NOTE — Patient Instructions (Signed)
Rebecca Lutz , Thank you for taking time to come for your Medicare Wellness Visit. I appreciate your ongoing commitment to your health goals. Please review the following plan we discussed and let me know if I can assist you in the future.   These are the goals we discussed:  Goals      Patient Stated     Maintain current health     Patient Stated     Lose weight         This is a list of the screening recommended for you and due dates:  Health Maintenance  Topic Date Due   COVID-19 Vaccine (7 - 2023-24 season) 12/27/2022*   Flu Shot  12/08/2022   Yearly kidney health urinalysis for diabetes  01/21/2023   Yearly kidney function blood test for diabetes  08/25/2023   Medicare Annual Wellness Visit  10/13/2023   Mammogram  06/20/2024   Colon Cancer Screening  05/07/2028   DTaP/Tdap/Td vaccine (2 - Td or Tdap) 09/22/2030   Pneumonia Vaccine  Completed   DEXA scan (bone density measurement)  Completed   Hepatitis C Screening  Completed   Zoster (Shingles) Vaccine  Completed   HPV Vaccine  Aged Out   Complete foot exam   Discontinued   Hemoglobin A1C  Discontinued   Eye exam for diabetics  Discontinued  *Topic was postponed. The date shown is not the original due date.    Advanced directives: copies in chart   Conditions/risks identified: lose weight   Next appointment: Follow up in one year for your annual wellness visit    Preventive Care 65 Years and Older, Female Preventive care refers to lifestyle choices and visits with your health care provider that can promote health and wellness. What does preventive care include? A yearly physical exam. This is also called an annual well check. Dental exams once or twice a year. Routine eye exams. Ask your health care provider how often you should have your eyes checked. Personal lifestyle choices, including: Daily care of your teeth and gums. Regular physical activity. Eating a healthy diet. Avoiding tobacco and drug  use. Limiting alcohol use. Practicing safe sex. Taking low-dose aspirin every day. Taking vitamin and mineral supplements as recommended by your health care provider. What happens during an annual well check? The services and screenings done by your health care provider during your annual well check will depend on your age, overall health, lifestyle risk factors, and family history of disease. Counseling  Your health care provider may ask you questions about your: Alcohol use. Tobacco use. Drug use. Emotional well-being. Home and relationship well-being. Sexual activity. Eating habits. History of falls. Memory and ability to understand (cognition). Work and work Astronomer. Reproductive health. Screening  You may have the following tests or measurements: Height, weight, and BMI. Blood pressure. Lipid and cholesterol levels. These may be checked every 5 years, or more frequently if you are over 93 years old. Skin check. Lung cancer screening. You may have this screening every year starting at age 64 if you have a 30-pack-year history of smoking and currently smoke or have quit within the past 15 years. Fecal occult blood test (FOBT) of the stool. You may have this test every year starting at age 52. Flexible sigmoidoscopy or colonoscopy. You may have a sigmoidoscopy every 5 years or a colonoscopy every 10 years starting at age 8. Hepatitis C blood test. Hepatitis B blood test. Sexually transmitted disease (STD) testing. Diabetes screening. This is done by  checking your blood sugar (glucose) after you have not eaten for a while (fasting). You may have this done every 1-3 years. Bone density scan. This is done to screen for osteoporosis. You may have this done starting at age 44. Mammogram. This may be done every 1-2 years. Talk to your health care provider about how often you should have regular mammograms. Talk with your health care provider about your test results, treatment  options, and if necessary, the need for more tests. Vaccines  Your health care provider may recommend certain vaccines, such as: Influenza vaccine. This is recommended every year. Tetanus, diphtheria, and acellular pertussis (Tdap, Td) vaccine. You may need a Td booster every 10 years. Zoster vaccine. You may need this after age 31. Pneumococcal 13-valent conjugate (PCV13) vaccine. One dose is recommended after age 9. Pneumococcal polysaccharide (PPSV23) vaccine. One dose is recommended after age 30. Talk to your health care provider about which screenings and vaccines you need and how often you need them. This information is not intended to replace advice given to you by your health care provider. Make sure you discuss any questions you have with your health care provider. Document Released: 05/22/2015 Document Revised: 01/13/2016 Document Reviewed: 02/24/2015 Elsevier Interactive Patient Education  2017 [employer].  Fall Prevention in the Home Falls can cause injuries. They can happen to people of all ages. There are many things you can do to make your home safe and to help prevent falls. What can I do on the outside of my home? Regularly fix the edges of walkways and driveways and fix any cracks. Remove anything that might make you trip as you walk through a door, such as a raised step or threshold. Trim any bushes or trees on the path to your home. Use bright outdoor lighting. Clear any walking paths of anything that might make someone trip, such as rocks or tools. Regularly check to see if handrails are loose or broken. Make sure that both sides of any steps have handrails. Any raised decks and porches should have guardrails on the edges. Have any leaves, snow, or ice cleared regularly. Use sand or salt on walking paths during winter. Clean up any spills in your garage right away. This includes oil or grease spills. What can I do in the bathroom? Use night lights. Install grab  bars by the toilet and in the tub and shower. Do not use towel bars as grab bars. Use non-skid mats or decals in the tub or shower. If you need to sit down in the shower, use a plastic, non-slip stool. Keep the floor dry. Clean up any water that spills on the floor as soon as it happens. Remove soap buildup in the tub or shower regularly. Attach bath mats securely with double-sided non-slip rug tape. Do not have throw rugs and other things on the floor that can make you trip. What can I do in the bedroom? Use night lights. Make sure that you have a light by your bed that is easy to reach. Do not use any sheets or blankets that are too big for your bed. They should not hang down onto the floor. Have a firm chair that has side arms. You can use this for support while you get dressed. Do not have throw rugs and other things on the floor that can make you trip. What can I do in the kitchen? Clean up any spills right away. Avoid walking on wet floors. Keep items that you use a  lot in easy-to-reach places. If you need to reach something above you, use a strong step stool that has a grab bar. Keep electrical cords out of the way. Do not use floor polish or wax that makes floors slippery. If you must use wax, use non-skid floor wax. Do not have throw rugs and other things on the floor that can make you trip. What can I do with my stairs? Do not leave any items on the stairs. Make sure that there are handrails on both sides of the stairs and use them. Fix handrails that are broken or loose. Make sure that handrails are as long as the stairways. Check any carpeting to make sure that it is firmly attached to the stairs. Fix any carpet that is loose or worn. Avoid having throw rugs at the top or bottom of the stairs. If you do have throw rugs, attach them to the floor with carpet tape. Make sure that you have a light switch at the top of the stairs and the bottom of the stairs. If you do not have them,  ask someone to add them for you. What else can I do to help prevent falls? Wear shoes that: Do not have high heels. Have rubber bottoms. Are comfortable and fit you well. Are closed at the toe. Do not wear sandals. If you use a stepladder: Make sure that it is fully opened. Do not climb a closed stepladder. Make sure that both sides of the stepladder are locked into place. Ask someone to hold it for you, if possible. Clearly mark and make sure that you can see: Any grab bars or handrails. First and last steps. Where the edge of each step is. Use tools that help you move around (mobility aids) if they are needed. These include: Canes. Walkers. Scooters. Crutches. Turn on the lights when you go into a dark area. Replace any light bulbs as soon as they burn out. Set up your furniture so you have a clear path. Avoid moving your furniture around. If any of your floors are uneven, fix them. If there are any pets around you, be aware of where they are. Review your medicines with your doctor. Some medicines can make you feel dizzy. This can increase your chance of falling. Ask your doctor what other things that you can do to help prevent falls. This information is not intended to replace advice given to you by your health care provider. Make sure you discuss any questions you have with your health care provider. Document Released: 02/19/2009 Document Revised: 10/01/2015 Document Reviewed: 05/30/2014 Elsevier Interactive Patient Education  2017 [employer].

## 2022-10-13 NOTE — Progress Notes (Signed)
I connected with  Rebecca Lutz on 10/13/22 by a audio enabled telemedicine application and verified that I am speaking with the correct person using two identifiers.  Patient Location: Home  Provider Location: Office/Clinic  I discussed the limitations of evaluation and management by telemedicine. The patient expressed understanding and agreed to proceed.   Patient Medicare AWV questionnaire was completed by the patient on 10/10/22; I have confirmed that all information answered by patient is correct and no changes since this date.     Subjective:   Rebecca Lutz Ally is a 72 y.o. female who presents for Medicare Annual (Subsequent) preventive examination.  Review of Systems     Cardiac Risk Factors include: advanced age (>36men, >2 women);dyslipidemia;hypertension;obesity (BMI >30kg/m2)     Objective:    Today's Vitals   10/13/22 1535  Weight: 165 lb (74.8 kg)   Body mass index is 31.18 kg/m.     10/13/2022    3:38 PM 06/07/2021    9:39 AM 05/28/2020    9:10 AM 10/16/2019    9:20 AM  Advanced Directives  Does Patient Have a Medical Advance Directive? No Yes Yes Yes  Type of Special educational needs teacher of [place];Living will Healthcare Power of [place];Living will Healthcare Power of [place];Living will  Copy of Healthcare Power of Attorney in Chart?  Yes - validated most recent copy scanned in chart (See row information) No - copy requested   Would patient like information on creating a medical advance directive? Yes (MAU/Ambulatory/Procedural Areas - Information given)       Current Medications (verified) Outpatient Encounter Medications as of 10/13/2022  Medication Sig   amLODipine (NORVASC) 5 MG tablet Take 1 tablet (5 mg total) by mouth daily.   ARIPiprazole (ABILIFY) 2 MG tablet Take 1 tablet (2 mg total) by mouth daily.   aspirin EC 81 MG tablet Take 81 mg by mouth daily.   ezetimibe (ZETIA) 10 MG tablet Take 1 tablet (10 mg total) by mouth daily.    levothyroxine (SYNTHROID) 100 MCG tablet Take 1 tablet (100 mcg total) by mouth daily.   metoprolol succinate (TOPROL-XL) 50 MG 24 hr tablet Take 1.5 tablets (75 mg total) by mouth daily.   Misc Natural Products (LUTEIN VISION BLEND PO) Take by mouth daily.   PREVIDENT 5000 BOOSTER PLUS 1.1 % PSTE Place onto teeth.   rosuvastatin (CRESTOR) 10 MG tablet Take 1 tablet (10 mg total) by mouth daily.   sertraline (ZOLOFT) 100 MG tablet Take 1 tablet (100 mg total) by mouth daily.   UNABLE TO FIND at bedtime. CPAP   No facility-administered encounter medications on file as of 10/13/2022.    Allergies (verified) Penicillins   History: Past Medical History:  Diagnosis Date   Anxiety    Asthma    Bipolar disorder (HCC)    CAD (coronary artery disease)    MI 2007   Depression    Heart attack (HCC)    High cholesterol    Hypertension    Idiopathic urticaria    NASH (nonalcoholic steatohepatitis)    OSA on CPAP    Osteoporosis    previosly treated with bonivas   Peripheral neuropathy    Plantar fasciitis    Sciatica    Thyroid disease    Past Surgical History:  Procedure Laterality Date   APPENDECTOMY     DILATION AND CURETTAGE OF UTERUS     idiopathic urticaria     LEG SURGERY Right 2020   poss achilles for plantar  fasciitis   NASH     osteoporosis     TONSILLECTOMY     Family History  Problem Relation Age of Onset   Lung cancer Mother    COPD Mother    Heart attack Mother    Heart failure Mother    Suicidality Father        ? bipolar disorder   Depression Father    Cancer Daughter    Social History   Socioeconomic History   Marital status: Widowed    Spouse name: Not on file   Number of children: 3   Years of education: Not on file   Highest education level: Some college, no degree  Occupational History   Not on file  Tobacco Use   Smoking status: Never    Passive exposure: Never   Smokeless tobacco: Never  Vaping Use   Vaping Use: Never used  Substance  and Sexual Activity   Alcohol use: Yes    Comment: occasional   Drug use: Never   Sexual activity: Not Currently  Other Topics Concern   Not on file  Social History Narrative   2 sons, 1 daughter (all in [place])-5 grands   Widowed 2020   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   Completed some college   Enjoys yard work, spending time with her grandchildren, painting, coloring and needlepoint   Lives in independent living at [facility]   Social Determinants of Health   Financial Resource Strain: Low Risk  (10/10/2022)   Overall Financial Resource Strain (CARDIA)    Difficulty of Paying Living Expenses: Not hard at all  Food Insecurity: No Food Insecurity (10/10/2022)   Hunger Vital Sign    Worried About Running Out of Food in the Last Year: Never true    Ran Out of Food in the Last Year: Never true  Transportation Needs: No Transportation Needs (10/10/2022)   PRAPARE - Administrator, Civil Service (Medical): No    Lack of Transportation (Non-Medical): No  Physical Activity: Insufficiently Active (10/10/2022)   Exercise Vital Sign    Days of Exercise per Week: 2 days    Minutes of Exercise per Session: 20 min  Stress: Stress Concern Present (10/10/2022)   [employer] of Occupational Health - Occupational Stress Questionnaire    Feeling of Stress : To some extent  Social Connections: Moderately Integrated (10/10/2022)   Social Connection and Isolation Panel [NHANES]    Frequency of Communication with Friends and Family: Twice a week    Frequency of Social Gatherings with Friends and Family: Once a week    Attends Religious Services: More than 4 times per year    Active Member of [employer] or Organizations: Yes    Attends Banker Meetings: More than 4 times per year    Marital Status: Widowed    Tobacco Counseling Counseling given: Not Answered   Clinical Intake:  Pre-visit preparation completed: Yes  Pain :  No/denies pain     BMI - recorded: 31.18 Nutritional Status: BMI > 30  Obese Nutritional Risks: None Diabetes: No  How often do you need to have someone help you when you read instructions, pamphlets, or other written materials from your doctor or pharmacy?: 1 - Never  Diabetic?no  Interpreter Needed?: No  Information entered by :: Lanier Ensign, LPN   Activities of Daily Living    10/10/2022    1:00 PM  In your present state  of health, do you have any difficulty performing the following activities:  Hearing? 0  Vision? 0  Difficulty concentrating or making decisions? 0  Walking or climbing stairs? 0  Dressing or bathing? 0  Doing errands, shopping? 0  Preparing Food and eating ? N  Using the Toilet? N  In the past six months, have you accidently leaked urine? Y  Comment at times wears a pad  Do you have problems with loss of bowel control? N  Managing your Medications? N  Managing your Finances? N  Housekeeping or managing your Housekeeping? N    Patient Care Team: Jeani Sow, MD as PCP - General (Family Medicine)  Indicate any recent Medical Services you may have received from other than [facility] providers in the past year (date may be approximate).     Assessment:   This is a routine wellness examination for Rebecca Lutz.  Hearing/Vision screen Hearing Screening - Comments:: Pt denies any hearing issues  Vision Screening - Comments:: Pt follows up with [facility] for annual eye   Dietary issues and exercise activities discussed: Current Exercise Habits: Home exercise routine, Type of exercise: walking, Time (Minutes): 20, Frequency (Times/Week): 2, Weekly Exercise (Minutes/Week): 40   Goals Addressed             This Visit's Progress    Patient Stated       Lose weight        Depression Screen    10/13/2022    3:37 PM 08/25/2022    8:32 AM 06/07/2021    9:42 AM 11/10/2020    8:10 AM 05/28/2020    9:13 AM  PHQ 2/9 Scores  PHQ - 2 Score 0 0 0 0 1   PHQ- 9 Score 0 3       Fall Risk    10/10/2022    1:00 PM 08/25/2022    8:32 AM 02/23/2022    8:20 AM 07/20/2021    8:25 AM 06/07/2021    9:40 AM  Fall Risk   Falls in the past year? 0 0 0 0 0  Number falls in past yr: 0 0 0 0 0  Injury with Fall?  0 0 0 0  Risk for fall due to : Impaired vision No Fall Risks No Fall Risks    Follow up Falls prevention discussed Falls evaluation completed Falls evaluation completed Falls evaluation completed;Education provided;Falls prevention discussed Falls prevention discussed    FALL RISK PREVENTION PERTAINING TO THE HOME:  Any stairs in or around the home? Yes  If so, are there any without handrails? No  Home free of loose throw rugs in walkways, pet beds, electrical cords, etc? Yes  Adequate lighting in your home to reduce risk of falls? Yes   ASSISTIVE DEVICES UTILIZED TO PREVENT FALLS:  Life alert? Yes  Use of a cane, walker or w/c? No  Grab bars in the bathroom? Yes  Shower chair or bench in shower? Yes  Elevated toilet seat or a handicapped toilet? Yes   TIMED UP AND GO:  Was the test performed? No .   Cognitive Function:        10/13/2022    3:40 PM  6CIT Screen  What Year? 0 points  What month? 0 points  What time? 0 points  Count back from 20 0 points  Months in reverse 0 points  Repeat phrase 0 points  Total Score 0 points    Immunizations Immunization History  Administered Date(s) Administered  Fluad Quad(high Dose 65+) 02/20/2019, 02/10/2021   Hepatitis A 02/21/2013, 08/22/2013   Hepatitis B 02/21/2013, 03/21/2013, 06/13/2013   Influenza-Unspecified 02/18/2020   PFIZER(Purple Top)SARS-COV-2 Vaccination 06/23/2019, 07/16/2019, 03/04/2020, 11/10/2020   Pfizer Covid Bivalent Pediatric Vaccine(44mos to <31yrs) 11/06/2021   Pfizer Covid-19 Vaccine Bivalent Booster 64yrs & up 02/10/2021   Pneumococcal Conjugate-13 01/14/2014   Pneumococcal Polysaccharide-23 05/11/2020   Tdap 09/21/2020   Zoster Recombinat  (Shingrix) 01/30/2017, 04/01/2017    TDAP status: Up to date  Flu Vaccine status: Due, Education has been provided regarding the importance of this vaccine. Advised may receive this vaccine at local pharmacy or Health Dept. Aware to provide a copy of the vaccination record if obtained from local pharmacy or Health Dept. Verbalized acceptance and understanding.  Pneumococcal vaccine status: Up to date  Covid-19 vaccine status: Completed vaccines  Qualifies for Shingles Vaccine? Yes   Zostavax completed Yes   Shingrix Completed?: Yes  Screening Tests Health Maintenance  Topic Date Due   COVID-19 Vaccine (7 - 2023-24 season) 12/27/2022 (Originally 01/07/2022)   INFLUENZA VACCINE  12/08/2022   Diabetic kidney evaluation - Urine ACR  01/21/2023   Diabetic kidney evaluation - eGFR measurement  08/25/2023   Medicare Annual Wellness (AWV)  10/13/2023   MAMMOGRAM  06/20/2024   Colonoscopy  05/07/2028   DTaP/Tdap/Td (2 - Td or Tdap) 09/22/2030   Pneumonia Vaccine 110+ Years old  Completed   DEXA SCAN  Completed   Hepatitis C Screening  Completed   Zoster Vaccines- Shingrix  Completed   HPV VACCINES  Aged Out   FOOT EXAM  Discontinued   HEMOGLOBIN A1C  Discontinued   OPHTHALMOLOGY EXAM  Discontinued    Health Maintenance  There are no preventive care reminders to display for this patient.   Colorectal cancer screening: Type of screening: Colonoscopy. Completed 05/07/18. Repeat every 10 years  Mammogram status: Completed 06/20/22. Repeat every year  Bone Density status: Completed 09/23/20. Results reflect: Bone density results: OSTEOPENIA. Repeat every 2 years.   Additional Screening:  Hepatitis C Screening:  Completed 05/11/20  Vision Screening: Recommended annual ophthalmology exams for early detection of glaucoma and other disorders of the eye. Is the patient up to date with their annual eye exam?  Yes  Who is the provider or what is the name of the office in which the patient  attends annual eye exams? [facility]  If pt is not established with a provider, would they like to be referred to a provider to establish care? No .   Dental Screening: Recommended annual dental exams for proper oral hygiene  Community Resource Referral / Chronic Care Management: CRR required this visit?  No   CCM required this visit?  No      Plan:     I have personally reviewed and noted the following in the patient's chart:   Medical and social history Use of alcohol, tobacco or illicit drugs  Current medications and supplements including opioid prescriptions. Patient is not currently taking opioid prescriptions. Functional ability and status Nutritional status Physical activity Advanced directives List of other physicians Hospitalizations, surgeries, and ER visits in previous 12 months Vitals Screenings to include cognitive, depression, and falls Referrals and appointments  In addition, I have reviewed and discussed with patient certain preventive protocols, quality metrics, and best practice recommendations. A written personalized care plan for preventive services as well as general preventive health recommendations were provided to patient.     Marzella Schlein, LPN   05/14/1094  Nurse Notes: none

## 2022-10-25 ENCOUNTER — Other Ambulatory Visit (INDEPENDENT_AMBULATORY_CARE_PROVIDER_SITE_OTHER): Payer: Medicare Other

## 2022-10-25 DIAGNOSIS — E038 Other specified hypothyroidism: Secondary | ICD-10-CM | POA: Diagnosis not present

## 2022-10-25 LAB — TSH: TSH: 1.33 u[IU]/mL (ref 0.35–5.50)

## 2022-10-25 NOTE — Progress Notes (Signed)
Back at goal-continue same dose

## 2022-11-14 DIAGNOSIS — F330 Major depressive disorder, recurrent, mild: Secondary | ICD-10-CM | POA: Diagnosis not present

## 2022-12-09 DIAGNOSIS — G4733 Obstructive sleep apnea (adult) (pediatric): Secondary | ICD-10-CM | POA: Diagnosis not present

## 2022-12-22 ENCOUNTER — Encounter: Payer: Self-pay | Admitting: Nurse Practitioner

## 2022-12-22 ENCOUNTER — Ambulatory Visit (INDEPENDENT_AMBULATORY_CARE_PROVIDER_SITE_OTHER): Payer: Medicare Other | Admitting: Nurse Practitioner

## 2022-12-22 ENCOUNTER — Telehealth: Payer: Self-pay | Admitting: Family Medicine

## 2022-12-22 VITALS — BP 142/74 | HR 62 | Temp 98.2°F | Ht 61.0 in | Wt 168.2 lb

## 2022-12-22 DIAGNOSIS — R1032 Left lower quadrant pain: Secondary | ICD-10-CM

## 2022-12-22 MED ORDER — POLYETHYLENE GLYCOL 3350 17 GM/SCOOP PO POWD: 17.0000 g | Freq: Two times a day (BID) | ORAL | 0 refills | Status: DC

## 2022-12-22 NOTE — Progress Notes (Signed)
   Acute Office Visit  Subjective:     Patient ID: Rebecca Lutz, female    DOB: 1950-07-28, 72 y.o.   MRN: [id number]  Chief Complaint  Patient presents with   Abdominal Pain    LLQ Pain since 12/18/22- no bowel movement since Friday/ Saturday, bloated and has some gas    HPI Patient is in today for abdominal pain for 4 days.   ABDOMINAL PAIN   Duration:days Onset: gradual Severity: moderate Quality: dull and sore Location:  LLQ  Episode duration:  Radiation: no Frequency: constant Alleviating factors:  Aggravating factors: Status: stable Treatments attempted: metamucil Fever: no Nausea: no Vomiting: no Weight loss: no Decreased appetite: no Diarrhea: no Constipation: yes - BM in 6 days Blood in stool: no Heartburn: no Jaundice: no Rash: no Dysuria/urinary frequency: no Hematuria: no History of sexually transmitted disease: no Recurrent NSAID use: no  ROS See pertinent positives and negatives per HPI.     Objective:    BP (!) 142/74   Pulse 62   Temp 98.2 F (36.8 C) (Oral)   Ht 5\' 1"  (1.549 m)   Wt 168 lb 3.2 oz (76.3 kg)   SpO2 98%   BMI 31.78 kg/m    Physical Exam Vitals and nursing note reviewed.  Constitutional:      General: She is not in acute distress.    Appearance: Normal appearance.  HENT:     Head: Normocephalic.  Eyes:     Conjunctiva/sclera: Conjunctivae normal.  Cardiovascular:     Rate and Rhythm: Normal rate and regular rhythm.     Pulses: Normal pulses.     Heart sounds: Normal heart sounds.  Pulmonary:     Effort: Pulmonary effort is normal.     Breath sounds: Normal breath sounds.  Abdominal:     General: Bowel sounds are decreased.     Tenderness: There is abdominal tenderness in the left lower quadrant. There is no guarding. Negative signs include Murphy's sign and McBurney's sign.  Musculoskeletal:     Cervical back: Normal range of motion.  Skin:    General: Skin is warm.  Neurological:     General: No  focal deficit present.     Mental Status: She is alert and oriented to person, place, and time.  Psychiatric:        Mood and Affect: Mood normal.        Behavior: Behavior normal.        Thought Content: Thought content normal.        Judgment: Judgment normal.      Assessment & Plan:   Problem List Items Addressed This Visit       Other   LLQ abdominal pain - Primary    LLQ abdominal pain x4 days. Last BM was 6 days ago. She is passing flatus. No red flags on exam, she does have tenderness to LLQ. Denies nausea, vomiting. Will have her start miralax BID and dulcolax daily prn constipation. Encourage fluids, can continue metamucil. Discussed ER precautions. Follow-up if not improving.        Meds ordered this encounter  Medications   polyethylene glycol powder (GLYCOLAX/MIRALAX) 17 GM/SCOOP powder    Sig: Take 17 g by mouth in the morning and at bedtime.    Dispense:  3350 g    Refill:  0    Return if symptoms worsen or fail to improve.  Gerre Scull, NP

## 2022-12-22 NOTE — Telephone Encounter (Signed)
Angelique Blonder, triage nurse, called stating final outcome was for patient to be seen within 4 hours. Patient has been scheduled for 11:20 on 8/15 with Lauren McElwee.   Patient Name First: Rebecca Lutz Last: Stump Gender: Female DOB: 13-Aug-1950 Age: 72 Y 15 D Return Phone Number: [phone] (Primary) Address: City/ State/ Zip: [place]  [address] Client [facility] - [facility], [facility] Provider Ruthine Dose, Ann Contact Type Call Who Is Calling Patient / Member / Family / Caregiver Call Type Triage / Clinical Relationship To Patient Self Return Phone Number [phone] (Primary) Chief Complaint Abdominal Pain Reason for Call Symptomatic / Request for Health Information Initial Comment Lower left abdominal pain since the 11th. Not had a BM since the 9th Translation No Nurse Assessment Nurse: Annye English, RN, Angelique Blonder Date/Time (Eastern Time): 12/22/2022 9:44:29 AM Confirm and document reason for call. If symptomatic, describe symptoms. ---Pt has not had BM since 8/9 and has abd pain. Does the patient have any new or worsening symptoms? ---Yes Will a triage be completed? ---Yes Related visit to physician within the last 2 weeks? ---No Does the PT have any chronic conditions? (i.e. diabetes, asthma, this includes High risk factors for pregnancy, etc.) ---No Is this a behavioral health or substance abuse call? ---No Guidelines Guideline Title Affirmed Question Affirmed Notes Nurse Date/Time (Eastern Time) Constipation [1] Constant abdominal pain AND [2] present > 2 hours Carmon, RN, [facility] 12/22/2022 9:45:05 AM Disp. Time Lamount Cohen Time) Disposition Final User 12/22/2022 9:46:24 AM See HCP within 4 Hours (or PCP triage) Yes Annye English, RN, Angelique Blonder Final Disposition 12/22/2022 9:46:24 AM See HCP within 4 Hours (or PCP triage) Yes Carmon, RN, Leighton Ruff Disagree/Comply Comply Caller Understands  Yes PreDisposition Call Doctor Care Advice Given Per Guideline SEE HCP (OR PCP TRIAGE) WITHIN 4 HOURS: CALL BACK IF: * You become worse CARE ADVICE given per Constipation (Adult) guideline.

## 2022-12-22 NOTE — Telephone Encounter (Addendum)
YI: This call has been transferred to triage nurse: the Triage Nurse. Once the result note has been entered staff can address the message at that time.  Patient called in with the following symptoms:  Red Word:abdominal pain lower left side (achy, continual pain-1 time sharp pain)  began 12/18/22. Unable to have  bowel movement since 12/16/22.   Please advise at Mobile [phone] (mobile)  Message is routed to Provider Pool.

## 2022-12-22 NOTE — Patient Instructions (Signed)
It was great to see you!  Start a capful of miralax twice a day. Mix this with water, coffee, juice, tea  If you still don't have a bowel movement tomorrow or the next day, start dulcolax 1 tablet daily with the miralax.   Make sure you are drinking plenty of water  Let's follow-up if your symptoms worsen or don't improve.   Take care,  Rodman Pickle, NP

## 2022-12-22 NOTE — Telephone Encounter (Signed)
FYI

## 2022-12-22 NOTE — Assessment & Plan Note (Addendum)
LLQ abdominal pain x4 days. Last BM was 6 days ago. She is passing flatus. No red flags on exam, she does have tenderness to LLQ. Denies nausea, vomiting. Will have her start miralax BID and dulcolax daily prn constipation. Encourage fluids, can continue metamucil. Discussed ER precautions. Follow-up if not improving.

## 2023-03-01 ENCOUNTER — Encounter: Payer: Self-pay | Admitting: Family Medicine

## 2023-03-01 ENCOUNTER — Ambulatory Visit: Payer: Medicare Other | Admitting: Family Medicine

## 2023-03-01 VITALS — BP 132/82 | HR 63 | Temp 97.8°F | Ht 61.0 in | Wt 167.4 lb

## 2023-03-01 DIAGNOSIS — E034 Atrophy of thyroid (acquired): Secondary | ICD-10-CM

## 2023-03-01 DIAGNOSIS — I10 Essential (primary) hypertension: Secondary | ICD-10-CM | POA: Diagnosis not present

## 2023-03-01 DIAGNOSIS — G8929 Other chronic pain: Secondary | ICD-10-CM

## 2023-03-01 DIAGNOSIS — K7581 Nonalcoholic steatohepatitis (NASH): Secondary | ICD-10-CM | POA: Diagnosis not present

## 2023-03-01 DIAGNOSIS — R300 Dysuria: Secondary | ICD-10-CM

## 2023-03-01 DIAGNOSIS — E7800 Pure hypercholesterolemia, unspecified: Secondary | ICD-10-CM

## 2023-03-01 DIAGNOSIS — F319 Bipolar disorder, unspecified: Secondary | ICD-10-CM

## 2023-03-01 DIAGNOSIS — M545 Low back pain, unspecified: Secondary | ICD-10-CM

## 2023-03-01 DIAGNOSIS — Z0000 Encounter for general adult medical examination without abnormal findings: Secondary | ICD-10-CM | POA: Diagnosis not present

## 2023-03-01 DIAGNOSIS — F419 Anxiety disorder, unspecified: Secondary | ICD-10-CM

## 2023-03-01 LAB — POC URINALSYSI DIPSTICK (AUTOMATED)
Bilirubin, UA: NEGATIVE
Blood, UA: NEGATIVE
Glucose, UA: NEGATIVE
Ketones, UA: NEGATIVE
Leukocytes, UA: NEGATIVE
Nitrite, UA: NEGATIVE
Protein, UA: NEGATIVE
Spec Grav, UA: >=1.03 — AB (ref 1.010–1.025)
Urobilinogen, UA: 0.2 U/dL
pH, UA: 5 (ref 5.0–8.0)

## 2023-03-01 LAB — CBC WITH DIFFERENTIAL/PLATELET
Basophils Absolute: 0 10*3/uL (ref 0.0–0.1)
Basophils Relative: 0.8 % (ref 0.0–3.0)
Eosinophils Absolute: 0.3 10*3/uL (ref 0.0–0.7)
Eosinophils Relative: 5.8 % — ABNORMAL HIGH (ref 0.0–5.0)
HCT: 36.3 % (ref 36.0–46.0)
Hemoglobin: 12.1 g/dL (ref 12.0–15.0)
Lymphocytes Relative: 24.9 % (ref 12.0–46.0)
Lymphs Abs: 1.1 10*3/uL (ref 0.7–4.0)
MCHC: 33.2 g/dL (ref 30.0–36.0)
MCV: 88 fL (ref 78.0–100.0)
Monocytes Absolute: 0.3 10*3/uL (ref 0.1–1.0)
Monocytes Relative: 6.3 % (ref 3.0–12.0)
Neutro Abs: 2.7 10*3/uL (ref 1.4–7.7)
Neutrophils Relative %: 62.2 % (ref 43.0–77.0)
Platelets: 165 10*3/uL (ref 150.0–400.0)
RBC: 4.13 Mil/uL (ref 3.87–5.11)
RDW: 13.2 % (ref 11.5–15.5)
WBC: 4.4 10*3/uL (ref 4.0–10.5)

## 2023-03-01 LAB — LIPID PANEL
Cholesterol: 125 mg/dL (ref 0–200)
HDL: 37.9 mg/dL — ABNORMAL LOW (ref >39.00)
LDL Cholesterol: 56 mg/dL (ref 0–99)
NonHDL: 86.8
Total CHOL/HDL Ratio: 3
Triglycerides: 152 mg/dL — ABNORMAL HIGH (ref 0.0–149.0)
VLDL: 30.4 mg/dL (ref 0.0–40.0)

## 2023-03-01 LAB — COMPREHENSIVE METABOLIC PANEL
ALT: 44 U/L — ABNORMAL HIGH (ref 0–35)
AST: 56 U/L — ABNORMAL HIGH (ref 0–37)
Albumin: 4.4 g/dL (ref 3.5–5.2)
Alkaline Phosphatase: 62 U/L (ref 39–117)
BUN: 15 mg/dL (ref 6–23)
CO2: 26 meq/L (ref 19–32)
Calcium: 9.8 mg/dL (ref 8.4–10.5)
Chloride: 106 meq/L (ref 96–112)
Creatinine, Ser: 0.73 mg/dL (ref 0.40–1.20)
GFR: 81.12 mL/min (ref >60.00)
Glucose, Bld: 109 mg/dL — ABNORMAL HIGH (ref 70–99)
Potassium: 4.6 meq/L (ref 3.5–5.1)
Sodium: 139 meq/L (ref 135–145)
Total Bilirubin: 0.8 mg/dL (ref 0.2–1.2)
Total Protein: 6.7 g/dL (ref 6.0–8.3)

## 2023-03-01 LAB — TSH: TSH: 3.74 u[IU]/mL (ref 0.35–5.50)

## 2023-03-01 LAB — HEMOGLOBIN A1C: Hgb A1c MFr Bld: 5.3 % (ref 4.6–6.5)

## 2023-03-01 MED ORDER — METOPROLOL SUCCINATE ER 50 MG PO TB24: 75.0000 mg | ORAL_TABLET | Freq: Every day | ORAL | 3 refills | Status: DC

## 2023-03-01 MED ORDER — LEVOTHYROXINE SODIUM 100 MCG PO TABS: 100.0000 ug | ORAL_TABLET | Freq: Every day | ORAL | 1 refills | Status: DC

## 2023-03-01 MED ORDER — ARIPIPRAZOLE 2 MG PO TABS: 2.0000 mg | ORAL_TABLET | Freq: Every day | ORAL | 1 refills | Status: DC

## 2023-03-01 MED ORDER — SERTRALINE HCL 100 MG PO TABS: 100.0000 mg | ORAL_TABLET | Freq: Every day | ORAL | 1 refills | Status: DC

## 2023-03-01 MED ORDER — ROSUVASTATIN CALCIUM 10 MG PO TABS: 10.0000 mg | ORAL_TABLET | Freq: Every day | ORAL | 1 refills | Status: DC

## 2023-03-01 NOTE — Assessment & Plan Note (Signed)
Chronic. Work on diet/exercise.  Check lft's

## 2023-03-01 NOTE — Assessment & Plan Note (Signed)
Chronic.  Controlled.  Continue amlodipine 5 mg and metoprolol 75 mg.  Check CBC,CMP,A1C

## 2023-03-01 NOTE — Progress Notes (Signed)
Phone [phone]   Subjective:   Patient is a 72 y.o. female presenting for annual physical.    Chief Complaint  Patient presents with   Hypertension   Annual Exam    Pt here for annual exam - fasting   Annual -  Reports she walks up and down stairs. Started an exercise program for 6 weeks, but due to her back pain discontinued.   HTN - Pt is on metoprolol 75 mg, amlodipine 5 mg  .  Bp's running 130s/80s . No ha/dizziness/cp/palp/edema/cough/sob  Hypothyroidism - Pt is on 100 mcg synthroid.   HLD - Pt is on Zetia 10mg  and Crestor 10 mg -no problems  Bipolar- Pt is on Abilify 2 mg and Zoloft 100 mg daily. Reports her moods are well controlled. No SI.   Dysuria - Complains of frequency, urgency, and burning during urination for the past 6 months. No abd pain  Back Pain - Complains of back pain in the SI area. States the pain is usually dull and becomes sharp when tuning in bed or in the motion of standing from sitting. Denies pain while walking. Started after she started exercising more.  See problem oriented charting- ROS- ROS: Gen: no fever, chills  Skin: no rash, itching ENT: no ear pain, ear drainage, rhinorrhea, sinus pressure, sore throat, +nasal congestion-more in the mornings when wakes up.  Ok during day Eyes: no blurry vision, double vision Resp: no cough, wheeze,SOB CV: no CP, palpitations, LE edema,  GI: no heartburn, n/v/d/c, abd pain GU: no hematuria +dysuria, urgency, frequency MSK: no joint pain, myalgias, +back pain Neuro: no dizziness, headache, weakness, vertigo Psych: no depression, anxiety, insomnia, SI   The following were reviewed and entered/updated in epic: Past Medical History:  Diagnosis Date   Anxiety    Asthma    Bipolar disorder (HCC)    CAD (coronary artery disease)    MI 2007   Depression    Heart attack (HCC)    High cholesterol    Hypertension    Idiopathic urticaria    NASH (nonalcoholic steatohepatitis)    OSA on CPAP     Osteoporosis    previosly treated with bonivas   Peripheral neuropathy    Plantar fasciitis    Sciatica    Thyroid disease    Patient Active Problem List   Diagnosis Date Noted   LLQ abdominal pain 12/22/2022   Dysuria 02/11/2021   Plantar fasciitis    High cholesterol    Anxiety    Asthma    Bipolar disorder (HCC)    CAD (coronary artery disease)    Depression    Osteoporosis    Peripheral neuropathy    Thyroid disease    Sciatica    Hypothyroidism 11/08/2019   NASH (nonalcoholic steatohepatitis) 11/08/2019   Hyperlipidemia 11/08/2019   Essential hypertension 11/08/2019   Bipolar depression (HCC) 11/08/2019   Seasonal allergies 11/08/2019   Idiopathic urticaria 11/08/2019   Nondisplaced fracture of fifth metatarsal bone, left foot, initial encounter for closed fracture 10/22/2019   OSA on CPAP 09/18/2019   Insomnia 09/18/2019   Past Surgical History:  Procedure Laterality Date   APPENDECTOMY     DILATION AND CURETTAGE OF UTERUS     idiopathic urticaria     LEG SURGERY Right 2020   poss achilles for plantar fasciitis   NASH     osteoporosis     TONSILLECTOMY      Family History  Problem Relation Age of Onset   Lung cancer  Mother    COPD Mother    Heart attack Mother    Heart failure Mother    Suicidality Father        ? bipolar disorder   Depression Father    Cancer Daughter     Medications- reviewed and updated Current Outpatient Medications  Medication Sig Dispense Refill   amLODipine (NORVASC) 5 MG tablet Take 1 tablet (5 mg total) by mouth daily. 90 tablet 3   aspirin EC 81 MG tablet Take 81 mg by mouth daily.     ezetimibe (ZETIA) 10 MG tablet Take 1 tablet (10 mg total) by mouth daily. 90 tablet 3   Misc Natural Products (LUTEIN VISION BLEND PO) Take by mouth daily.     polyethylene glycol powder (GLYCOLAX/MIRALAX) 17 GM/SCOOP powder Take 17 g by mouth in the morning and at bedtime. 3350 g 0   PREVIDENT 5000 BOOSTER PLUS 1.1 % PSTE Place onto  teeth.     UNABLE TO FIND at bedtime. CPAP     ARIPiprazole (ABILIFY) 2 MG tablet Take 1 tablet (2 mg total) by mouth daily. 90 tablet 1   levothyroxine (SYNTHROID) 100 MCG tablet Take 1 tablet (100 mcg total) by mouth daily. 90 tablet 1   metoprolol succinate (TOPROL-XL) 50 MG 24 hr tablet Take 1.5 tablets (75 mg total) by mouth daily. 135 tablet 3   rosuvastatin (CRESTOR) 10 MG tablet Take 1 tablet (10 mg total) by mouth daily. 90 tablet 1   sertraline (ZOLOFT) 100 MG tablet Take 1 tablet (100 mg total) by mouth daily. 90 tablet 1   No current facility-administered medications for this visit.    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 DP 2+B LUNGS: CTAB. No wheezes ABDOMEN:  BS+, soft, NTND, No HSM, no masses EXT:  no edema MSK: no gross abnormalities. MS 5/5 all 4 SLR negative, tender left SI NEURO: A&O x3.  CN II-XII intact.  PSYCH: normal mood. Good eye contact   UA neg    Assessment and Plan   Health Maintenance counseling: 1. Anticipatory guidance: Patient counseled regarding regular dental exams q6 months, eye exams,  avoiding smoking and second hand smoke, limiting alcohol to 1 beverage per day, no illicit drugs.   2.  Risk factor reduction:  Advised patient of need for regular exercise and diet rich and fruits and vegetables to reduce risk of heart attack and stroke.  Exercise- Reports she walks up and down stairs. Started an exercise program for 6 weeks, but due to her back pain discontinued. .  Wt Readings from Last 3 Encounters:  03/01/23 167 lb  6.4 oz (75.9 kg)  12/22/22 168 lb 3.2 oz (76.3 kg)  10/13/22 165 lb (74.8 kg)   3. Immunizations/screenings/ancillary studies Immunization History  Administered Date(s) Administered   Fluad Quad(high Dose 65+) 02/20/2019, 02/10/2021   Hepatitis A 02/21/2013, 08/22/2013   Hepatitis B 02/21/2013, 03/21/2013, 06/13/2013   Influenza-Unspecified 02/18/2020   PFIZER(Purple Top)SARS-COV-2 Vaccination 06/23/2019, 07/16/2019, 03/04/2020, 11/10/2020   Pfizer Covid Bivalent Pediatric Vaccine(68mos to <67yrs) 11/06/2021   Pfizer Covid-19 Vaccine Bivalent Booster 64yrs & up 02/10/2021   Pneumococcal Conjugate-13 01/14/2014   Pneumococcal Polysaccharide-23 05/11/2020   Tdap 09/21/2020   Unspecified SARS-COV-2 Vaccination 02/22/2023   Zoster Recombinant(Shingrix) 01/30/2017, 04/01/2017   There are no preventive care reminders to display for this patient.   4. Cervical cancer screening-  5. Breast cancer screening-  mammogram utd, 2024 6. Colon cancer screening - utd, 2019 7. Skin cancer screening- advised regular sunscreen use. Denies worrisome, changing, or new skin lesions.  8. Birth control/STD check- n/a 9. Osteoporosis screening- utd, 2022 10. Smoking associated screening - non smoker  Wellness examination  Essential hypertension Assessment & Plan: Chronic.  Controlled.  Continue amlodipine 5 mg and metoprolol 75 mg.  Check CBC,CMP,A1C  Orders: -     Comprehensive metabolic panel -     CBC with Differential/Platelet -     Metoprolol Succinate ER; Take 1.5 tablets (75 mg total) by mouth daily.  Dispense: 135 tablet; Refill: 3  Pure  hypercholesterolemia Assessment & Plan: Chronic.  Controlled.  Continue crestor 10 mg and zetia 10 mg.  Check CMP,lipids  Orders: -     Lipid panel -     Comprehensive metabolic panel -     Rosuvastatin Calcium; Take 1 tablet (10 mg total) by mouth daily.  Dispense: 90 tablet; Refill: 1  Hypothyroidism due to acquired atrophy of thyroid Assessment & Plan: Chronic.  Controlled.  Continue synthroid 100 mcg.    Orders: -     TSH -     Levothyroxine Sodium; Take 1 tablet (100 mcg total) by mouth daily.  Dispense: 90 tablet; Refill: 1  NASH (nonalcoholic steatohepatitis) Assessment & Plan: Chronic. Work on diet/exercise.  Check lft's  Orders: -     Comprehensive metabolic panel -     Hemoglobin A1c  Anxiety Assessment & Plan: Chronic.  Controlled.  Continue Abilify 2 mg daily and Zoloft 100 mg daily  Orders: -     ARIPiprazole; Take 1 tablet (2 mg total) by mouth daily.  Dispense: 90 tablet; Refill: 1 -     Sertraline HCl; Take 1 tablet (100 mg total) by mouth daily.  Dispense: 90 tablet; Refill: 1  Bipolar depression (HCC) Assessment & Plan: Chronic.  Controlled.  Continue Abilify 2 mg daily and Zoloft 100 mg daily  Orders: -     ARIPiprazole; Take 1 tablet (2 mg total) by mouth daily.  Dispense: 90 tablet; Refill: 1 -     Sertraline HCl; Take 1 tablet (100 mg total) by mouth daily.  Dispense: 90 tablet; Refill: 1  Dysuria Assessment & Plan: UA negative.  Check culture.  If culture is negative, follow-up for vaginal exam.  May just be dryness.  Orders: -     POCT Urinalysis Dipstick (Automated) -     Urine Culture  Chronic left-sided low back pain without sciatica   Wellness-antic guidance.  RHM UTD  L low back pain-stretches.  If not improving, PT  Recommended follow up: Return in about 6 months (around 08/30/2023) for chronic follow-up.  Lab/Order associations:  fasting  I,Anaiya N Rice,acting as a scribe for [name], MD.,have documented all relevant  documentation on the behalf of [name], MD,as directed by  [name], MD while in the presence of [name], MD.  I, [name], MD, have reviewed all documentation for this visit. The documentation on 03/01/23 for the exam, diagnosis, procedures, and orders are all accurate and complete.   [name], MD

## 2023-03-01 NOTE — Assessment & Plan Note (Signed)
Chronic.  Controlled.  Continue crestor 10 mg and zetia 10 mg.  Check CMP,lipids

## 2023-03-01 NOTE — Assessment & Plan Note (Signed)
Chronic.  Controlled.  Continue Abilify 2 mg daily and Zoloft 100 mg daily

## 2023-03-01 NOTE — Progress Notes (Signed)
Labs are stable/at goal except: 1.  Triglycerides are still little bit elevated and HDL (good cholesterol) has decreased.  Continue to work on diet/exercise 2.  Liver tests are elevated but stable. 3.  Urinalysis was negative.  Awaiting culture, however I doubt it will be positive.  See if she wants to schedule an appointment to follow-up on the burning with urination

## 2023-03-01 NOTE — Assessment & Plan Note (Signed)
Chronic.  Controlled.  Continue synthroid 100 mcg.

## 2023-03-01 NOTE — Assessment & Plan Note (Signed)
UA negative.  Check culture.  If culture is negative, follow-up for vaginal exam.  May just be dryness.

## 2023-03-01 NOTE — Patient Instructions (Addendum)
It was very nice to see you today!  Get RSV as well  Benefiber daily to prevent constipation.  Nasal saline at night. And am.   PLEASE NOTE:  If you had any lab tests please let us know if you have not heard back within a few days. You may see your results on MyChart before we have a chance to review them but we will give you a call once they are reviewed by [place]. If we ordered any referrals today, please let us know if you have not heard from their office within the next week.   Please try these tips to maintain a healthy lifestyle:  Eat most of your calories during the day when you are active. Eliminate processed foods including packaged sweets (pies, cakes, cookies), reduce intake of potatoes, white bread, white pasta, and white rice. Look for whole grain options, oat flour or almond flour.  Each meal should contain half fruits/vegetables, one quarter protein, and one quarter carbs (no bigger than a computer mouse).  Cut down on sweet beverages. This includes juice, soda, and sweet tea. Also watch fruit intake, though this is a healthier sweet option, it still contains natural sugar! Limit to 3 servings daily.  Drink at least 1 glass of water with each meal and aim for at least 8 glasses per day  Exercise at least 150 minutes every week.

## 2023-03-03 LAB — URINE CULTURE
MICRO NUMBER:: 15633775
Result:: NO GROWTH
SPECIMEN QUALITY:: ADEQUATE

## 2023-03-03 NOTE — Progress Notes (Signed)
Culture negative.  She can schedule appointment if she wants me to do pelvic exam as we discussed at appointment

## 2023-03-09 ENCOUNTER — Other Ambulatory Visit (HOSPITAL_COMMUNITY)
Admission: RE | Admit: 2023-03-09 | Discharge: 2023-03-09 | Disposition: A | Discharge status: Home / Self Care | Payer: Medicare Other | Source: Ambulatory Visit | Attending: Family Medicine | Admitting: Family Medicine

## 2023-03-09 ENCOUNTER — Encounter: Payer: Self-pay | Admitting: Family Medicine

## 2023-03-09 ENCOUNTER — Ambulatory Visit: Payer: Medicare Other | Admitting: Family Medicine

## 2023-03-09 VITALS — BP 128/65 | HR 65 | Temp 98.0°F | Resp 18 | Ht 61.0 in | Wt 168.4 lb

## 2023-03-09 DIAGNOSIS — R300 Dysuria: Secondary | ICD-10-CM | POA: Diagnosis not present

## 2023-03-09 DIAGNOSIS — N898 Other specified noninflammatory disorders of vagina: Secondary | ICD-10-CM | POA: Diagnosis not present

## 2023-03-09 DIAGNOSIS — N3281 Overactive bladder: Secondary | ICD-10-CM | POA: Diagnosis not present

## 2023-03-09 LAB — POC URINALSYSI DIPSTICK (AUTOMATED)
Bilirubin, UA: NEGATIVE
Blood, UA: NEGATIVE
Glucose, UA: NEGATIVE
Ketones, UA: NEGATIVE
Leukocytes, UA: NEGATIVE
Nitrite, UA: NEGATIVE
Protein, UA: NEGATIVE
Spec Grav, UA: 1.02 (ref 1.010–1.025)
Urobilinogen, UA: 0.2 U/dL
pH, UA: 6 (ref 5.0–8.0)

## 2023-03-09 MED ORDER — OXYBUTYNIN CHLORIDE ER 5 MG PO TB24: 5.0000 mg | ORAL_TABLET | Freq: Every day | ORAL | 1 refills | Status: DC

## 2023-03-09 MED ORDER — ESTRADIOL 0.1 MG/GM VA CREA: TOPICAL_CREAM | VAGINAL | 3 refills | Status: DC

## 2023-03-09 NOTE — Progress Notes (Signed)
Subjective:     Patient ID: Rebecca Lutz, female    DOB: July 29, 1950, 72 y.o.   MRN: [id number]  Chief Complaint  Patient presents with   Vaginal Itching   Vaginal burning    Still having burning and pressure from last office visit    HPI  Vaginal burning/itching - Pt was last seen on 10/23 complaining of frequency, urgency, and burning during urination for the past 6 months. No abd pain at the time.   Today, pt reports persistent vaginal burning, urgency, itchiness, and pelvic pressure. Urgency sometimes 30 minutes after using the bath room. Urinalysis and culture were both negative on 10/23.gets urge and "gotta go"   There are no preventive care reminders to display for this patient.  Past Medical History:  Diagnosis Date   Anxiety    Asthma    Bipolar disorder (HCC)    CAD (coronary artery disease)    MI 2007   Depression    Heart attack (HCC)    High cholesterol    Hypertension    Idiopathic urticaria    NASH (nonalcoholic steatohepatitis)    OSA on CPAP    Osteoporosis    previosly treated with bonivas   Peripheral neuropathy    Plantar fasciitis    Sciatica    Thyroid disease     Past Surgical History:  Procedure Laterality Date   APPENDECTOMY     DILATION AND CURETTAGE OF UTERUS     idiopathic urticaria     LEG SURGERY Right 2020   poss achilles for plantar fasciitis   NASH     osteoporosis     TONSILLECTOMY       Current Outpatient Medications:    amLODipine (NORVASC) 5 MG tablet, Take 1 tablet (5 mg total) by mouth daily., Disp: 90 tablet, Rfl: 3   ARIPiprazole (ABILIFY) 2 MG tablet, Take 1 tablet (2 mg total) by mouth daily., Disp: 90 tablet, Rfl: 1   aspirin EC 81 MG tablet, Take 81 mg by mouth daily., Disp: , Rfl:    estradiol (ESTRACE) 0.1 MG/GM vaginal cream, Use pea sized amount to outside daily for 7 days then 3x/wk, Disp: 42.5 g, Rfl: 3   ezetimibe (ZETIA) 10 MG tablet, Take 1 tablet (10 mg total) by mouth daily., Disp: 90 tablet,  Rfl: 3   levothyroxine (SYNTHROID) 100 MCG tablet, Take 1 tablet (100 mcg total) by mouth daily., Disp: 90 tablet, Rfl: 1   metoprolol succinate (TOPROL-XL) 50 MG 24 hr tablet, Take 1.5 tablets (75 mg total) by mouth daily., Disp: 135 tablet, Rfl: 3   Misc Natural Products (LUTEIN VISION BLEND PO), Take by mouth daily., Disp: , Rfl:    oxybutynin (DITROPAN XL) 5 MG 24 hr tablet, Take 1 tablet (5 mg total) by mouth at bedtime., Disp: 30 tablet, Rfl: 1   polyethylene glycol powder (GLYCOLAX/MIRALAX) 17 GM/SCOOP powder, Take 17 g by mouth in the morning and at bedtime., Disp: 3350 g, Rfl: 0   PREVIDENT 5000 BOOSTER PLUS 1.1 % PSTE, Place onto teeth., Disp: , Rfl:    rosuvastatin (CRESTOR) 10 MG tablet, Take 1 tablet (10 mg total) by mouth daily., Disp: 90 tablet, Rfl: 1   sertraline (ZOLOFT) 100 MG tablet, Take 1 tablet (100 mg total) by mouth daily., Disp: 90 tablet, Rfl: 1   UNABLE TO FIND, at bedtime. CPAP, Disp: , Rfl:   Allergies  Allergen Reactions   Penicillins Rash   ROS neg/noncontributory except as noted HPI/below  Objective:    BP 128/65   Pulse 65   Temp 98 F (36.7 C) (Temporal)   Resp 18   Ht 5\' 1"  (1.549 m)   Wt 168 lb 6 oz (76.4 kg)   SpO2 98%   BMI 31.81 kg/m  Wt Readings from Last 3 Encounters:  03/09/23 168 lb 6 oz (76.4 kg)  03/01/23 167 lb 6.4 oz (75.9 kg)  12/22/22 168 lb 3.2 oz (76.3 kg)    Physical Exam   Gen: WDWN NAD HEENT: NCAT, conjunctiva not injected, sclera nonicteric MSK: no gross abnormalities.  NEURO: A&O x3.  CN II-XII intact.  PSYCH: normal mood. Good eye contact   Genitourinary              mons:  Normal              External genitalia:  Normal appearing vulva with no masses, tenderness, or lesions  just dry             BUS/Urethra/Skene's glands:  Normal             Vagina:  Normal appearing with pale appearance and no discharge, no lesions. Positive dryness. No rugae. Walls collapse in easily.              Cervix:  Normal  appearing without discharge or lesions             Uterus:  Normal in size, shape and contour.  Midline and mobile, nontender. +Decent support.              Adnexa/parametria:                           Rt:        Normal in size, without masses or tenderness.                         Lt:        Normal in size, without masses or tenderness.             Anus and perineum: +Hemorrhoids  Chaperone present QJ  Results for orders placed or performed in visit on 03/09/23  POCT Urinalysis Dipstick (Automated)  Result Value Ref Range   Color, UA YELLOW    Clarity, UA CLEAR    Glucose, UA Negative Negative   Bilirubin, UA NEG    Ketones, UA NEG    Spec Grav, UA 1.020 1.010 - 1.025   Blood, UA NEG    pH, UA 6.0 5.0 - 8.0   Protein, UA Negative Negative   Urobilinogen, UA 0.2 0.2 or 1.0 E.U./dL   Nitrite, UA NEG    Leukocytes, UA Negative Negative        Assessment & Plan:  Dysuria -     POCT Urinalysis Dipstick (Automated) -     Estradiol; Use pea sized amount to outside daily for 7 days then 3x/wk  Dispense: 42.5 g; Refill: 3 -     Cervicovaginal ancillary only  OAB (overactive bladder) -     oxyBUTYnin Chloride ER; Take 1 tablet (5 mg total) by mouth at bedtime.  Dispense: 30 tablet; Refill: 1  Vaginal dryness -     Estradiol; Use pea sized amount to outside daily for 7 days then 3x/wk  Dispense: 42.5 g; Refill: 3   Dysuria-UA, cx, and today's UA neg.  Suspect OAB/contact irrit(dryness) externally.  Obtained aptima swab.  Will  do estrogen cream to outside.  Pt declined gyn/urol  wants to try this first OAB-will try oxybutynin er 5mg  daily   SED.  Pt will message w/progress Vag dryness-external estradio cream  No follow-ups on file.   I, Isabelle Course, acting as a scribe for [name], MD., have documented all relevant documentation on the behalf of [name], MD, as directed by  [name], MD while in the presence of [name], MD.  I, [name], MD, have reviewed all  documentation for this visit. The documentation on 03/09/23 for the exam, diagnosis, procedures, and orders are all accurate and complete.  [name], MD

## 2023-03-10 ENCOUNTER — Other Ambulatory Visit: Payer: Self-pay | Admitting: Family Medicine

## 2023-03-10 LAB — CERVICOVAGINAL ANCILLARY ONLY
Bacterial Vaginitis (gardnerella): POSITIVE — AB
Candida Glabrata: NEGATIVE
Candida Vaginitis: NEGATIVE
Comment: NEGATIVE
Comment: NEGATIVE
Comment: NEGATIVE
Comment: NEGATIVE
Trichomonas: NEGATIVE

## 2023-03-10 MED ORDER — METRONIDAZOLE 500 MG PO TABS
500.0000 mg | ORAL_TABLET | Freq: Two times a day (BID) | ORAL | 0 refills | Status: AC
Start: 2023-03-10 — End: 2023-03-17

## 2023-03-10 NOTE — Progress Notes (Signed)
[facility] has BV.  Rx for flagyl sent to pharm.  Please verivy got message, etc.

## 2023-03-11 ENCOUNTER — Encounter: Payer: Self-pay | Admitting: Family Medicine

## 2023-03-16 DIAGNOSIS — G4733 Obstructive sleep apnea (adult) (pediatric): Secondary | ICD-10-CM | POA: Diagnosis not present

## 2023-05-09 ENCOUNTER — Other Ambulatory Visit: Payer: Self-pay | Admitting: Family Medicine

## 2023-05-09 DIAGNOSIS — Z0000 Encounter for general adult medical examination without abnormal findings: Secondary | ICD-10-CM

## 2023-05-30 ENCOUNTER — Other Ambulatory Visit: Payer: Self-pay

## 2023-05-30 MED ORDER — AMLODIPINE BESYLATE 5 MG PO TABS: 5.0000 mg | ORAL_TABLET | Freq: Every day | ORAL | 0 refills | Status: DC

## 2023-06-14 ENCOUNTER — Ambulatory Visit: Payer: Medicare Other | Admitting: Internal Medicine

## 2023-06-16 DIAGNOSIS — G4733 Obstructive sleep apnea (adult) (pediatric): Secondary | ICD-10-CM | POA: Diagnosis not present

## 2023-06-22 ENCOUNTER — Ambulatory Visit
Admission: RE | Admit: 2023-06-22 | Discharge: 2023-06-22 | Disposition: A | Discharge status: Home / Self Care | Payer: Medicare Other | Source: Ambulatory Visit | Attending: Family Medicine | Admitting: Family Medicine

## 2023-06-22 DIAGNOSIS — Z1231 Encounter for screening mammogram for malignant neoplasm of breast: Secondary | ICD-10-CM | POA: Diagnosis not present

## 2023-06-22 DIAGNOSIS — Z0000 Encounter for general adult medical examination without abnormal findings: Secondary | ICD-10-CM

## 2023-06-27 ENCOUNTER — Encounter: Payer: Self-pay | Admitting: Family Medicine

## 2023-06-29 ENCOUNTER — Ambulatory Visit: Payer: Medicare Other | Admitting: Internal Medicine

## 2023-07-13 ENCOUNTER — Ambulatory Visit: Payer: Medicare Other | Attending: Internal Medicine | Admitting: Internal Medicine

## 2023-07-13 VITALS — Ht 61.0 in | Wt 161.0 lb

## 2023-07-13 DIAGNOSIS — I10 Essential (primary) hypertension: Secondary | ICD-10-CM | POA: Diagnosis not present

## 2023-07-13 NOTE — Patient Instructions (Signed)
 Medication Instructions:  No Changes *If you need a refill on your cardiac medications before your next appointment, please call your pharmacy*   Lab Work: No Labs If you have labs (blood work) drawn today and your tests are completely normal, you will receive your results only by: MyChart Message (if you have MyChart) OR A paper copy in the mail If you have any lab test that is abnormal or we need to change your treatment, we will call you to review the results.   Testing/Procedures: No Testing   Follow-Up: At [facility], you and your health needs are our priority.  As part of our continuing mission to provide you with exceptional heart care, we have created designated Provider Care Teams.  These Care Teams include your primary Cardiologist (physician) and Advanced Practice Providers (APPs -  Physician Assistants and Nurse Practitioners) who all work together to provide you with the care you need, when you need it.  We recommend signing up for the patient portal called "MyChart".  Sign up information is provided on this After Visit Summary.  MyChart is used to connect with patients for Virtual Visits (Telemedicine).  Patients are able to view lab/test results, encounter notes, upcoming appointments, etc.  Non-urgent messages can be sent to your provider as well.   To learn more about what you can do with MyChart, go to [email or web address].    Your next appointment:   1 year(s)  Provider:   Robet Leu, PA-C      Other Instructions   1st Floor: - Lobby - Registration  - Pharmacy  - Lab - Cafe  2nd Floor: - PV Lab - Diagnostic Testing (echo, CT, nuclear med)  3rd Floor: - Vacant  4th Floor: - TCTS (cardiothoracic surgery) - AFib Clinic - Structural Heart Clinic - Vascular Surgery  - Vascular Ultrasound  5th Floor: - HeartCare Cardiology (general and EP) - Clinical Pharmacy for coumadin, hypertension, lipid, weight-loss medications, and med  management appointments    Valet parking services will be available as well.

## 2023-07-13 NOTE — Progress Notes (Addendum)
 Cardiology Office Note:    Date:  07/13/2023   ID:  Coleta, Grosshans Sep 22, 1950, MRN [id number]  PCP:  Jeani Sow, MD   [facility] Providers Cardiologist:  None     Referring MD: Jeani Sow, MD   No chief complaint on file. HTN  History of Present Illness:    Rebecca Lutz is a 73 y.o. female with a hx of MI in 2007 with normal CORS, CAC with score 89, 69th percentile, HTN, who follows with Dr. Dulce Sellar.   She lives in [place]. 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 She walks up and down the stairs. She is working on finding classes that she might like. She works outside when the weather is nice. She is asymptomatic. Her PCP is Dr. Ruthine Dose. She has had a recent increase in her BP. She thinks the salt is too high at [facility]. They have a lot of processed food.  She is on metop succinate XL 50 mg daily  In terms of her MI in 2007, she was having a significant argument with an employee. Her CORS were not significantly obstructive. No hx of CHF. Recent CAC 2022 is < 75th percentile  Interim 07/13/2023 Video virtual visit :she had COVID a few weeks ago. She denies CP. She lives in [facility]. She has omron it was 149/69 mmHg. She denies SOB. No CHF symptoms.     Current Medications: Current Outpatient Medications on File Prior to Visit  Medication Sig Dispense Refill   amLODipine (NORVASC) 5 MG tablet Take 1 tablet (5 mg total) by mouth daily. 90 tablet 0   ARIPiprazole (ABILIFY) 2 MG tablet Take 1 tablet (2 mg total) by mouth daily. 90 tablet 1   aspirin EC 81 MG tablet Take 81 mg by mouth daily.     estradiol (ESTRACE) 0.1 MG/GM vaginal cream Use pea sized amount to outside daily for 7 days then 3x/wk 42.5 g 3   ezetimibe (ZETIA) 10 MG tablet Take 1 tablet (10 mg total) by mouth daily. 90 tablet 3   levothyroxine (SYNTHROID) 100 MCG tablet Take 1 tablet (100 mcg total) by mouth daily. 90 tablet 1   metoprolol succinate (TOPROL-XL) 50 MG 24 hr tablet  Take 1.5 tablets (75 mg total) by mouth daily. 135 tablet 3   Misc Natural Products (LUTEIN VISION BLEND PO) Take by mouth daily.     oxybutynin (DITROPAN XL) 5 MG 24 hr tablet Take 1 tablet (5 mg total) by mouth at bedtime. 30 tablet 1   polyethylene glycol powder (GLYCOLAX/MIRALAX) 17 GM/SCOOP powder Take 17 g by mouth in the morning and at bedtime. 3350 g 0   PREVIDENT 5000 BOOSTER PLUS 1.1 % PSTE Place onto teeth.     rosuvastatin (CRESTOR) 10 MG tablet Take 1 tablet (10 mg total) by mouth daily. 90 tablet 1   sertraline (ZOLOFT) 100 MG tablet Take 1 tablet (100 mg total) by mouth daily. 90 tablet 1   UNABLE TO FIND at bedtime. CPAP     No current facility-administered medications on file prior to visit.    Allergies:   Penicillins    ROS:   Please see the history of present illness.     All other systems reviewed and are negative.  EKGs/Labs/Other Studies Reviewed:    The following studies were reviewed today:   EKG:  EKG is  ordered today.  The ekg ordered today demonstrates   06/06/2022- sinus bradycardia HR 58 bpm  Recent Labs: 03/01/2023: ALT 44; BUN 15; Creatinine, Ser 0.73; Hemoglobin 12.1; Platelets 165.0; Potassium 4.6; Sodium 139; TSH 3.74   Recent Lipid Panel    Component Value Date/Time   CHOL 125 03/01/2023 0921   TRIG 152.0 (H) 03/01/2023 0921   HDL 37.90 (L) 03/01/2023 0921   CHOLHDL 3 03/01/2023 0921   VLDL 30.4 03/01/2023 0921   LDLCALC 56 03/01/2023 0921   LDLDIRECT 75.0 08/10/2020 0846     Risk Assessment/Calculations:     Physical Exam:    VS:   There were no vitals filed for this visit.    Wt Readings from Last 3 Encounters:  03/09/23 168 lb 6 oz (76.4 kg)  03/01/23 167 lb 6.4 oz (75.9 kg)  12/22/22 168 lb 3.2 oz (76.3 kg)    Well appearing MMM Normal WOB No abdominal distension Normal Affect     ASSESSMENT:    CAD: reported hx of MI 2007. But had normal CORS. ?MINOCA. 69th percentile which is lower risk, <75th percentile  for age.  No echo in the system -continue asa 81 mg daily -continue crestor 10 mg daily (LDL 49 mg/dL 1/61/0960)  HTN: Recommend ambulatory monitoring consistently with her omron.  Cont. norvasc 5 mg daily. Can continue metop 50 XL. Can continue to titrate with her PCP. BP goal < 130/80 mmHg PLAN:    In order of problems listed above:   Follow up 12 months with an APP      Virtual Visit via Video Note  I connected with Karna Dupes on 07/21/23 at  9:20 AM EST by a video enabled telemedicine application and verified that I am speaking with the correct person using two identifiers.  Location: Patient: Home Provider: Home   I discussed the limitations of evaluation and management by telemedicine and the availability of in person appointments. The patient expressed understanding and agreed to proceed.  I discussed the assessment and treatment plan with the patient. The patient was provided an opportunity to ask questions and all were answered. The patient agreed with the plan and demonstrated an understanding of the instructions.   The patient was advised to call back or seek an in-person evaluation if the symptoms worsen or if the condition fails to improve as anticipated.  I provided 15 minutes of non-face-to-face time during this encounter.   Maisie Fus, MD   Medication Adjustments/Labs and Tests Ordered: Current medicines are reviewed at length with the patient today.  Concerns regarding medicines are outlined above.  No orders of the defined types were placed in this encounter.  No orders of the defined types were placed in this encounter.   There are no Patient Instructions on file for this visit.   Signed, Maisie Fus, MD  07/13/2023 8:41 AM    [facility]

## 2023-07-21 NOTE — Addendum Note (Signed)
 Addended byCarolan Clines on: 07/21/2023 05:17 PM   Modules accepted: Level of Service

## 2023-08-30 ENCOUNTER — Ambulatory Visit: Payer: Medicare Other | Admitting: Family Medicine

## 2023-09-05 ENCOUNTER — Encounter: Payer: Self-pay | Admitting: Family Medicine

## 2023-09-05 ENCOUNTER — Ambulatory Visit (INDEPENDENT_AMBULATORY_CARE_PROVIDER_SITE_OTHER): Admitting: Family Medicine

## 2023-09-05 VITALS — BP 138/65 | HR 55 | Temp 97.2°F | Resp 16 | Ht 61.0 in | Wt 159.0 lb

## 2023-09-05 DIAGNOSIS — I10 Essential (primary) hypertension: Secondary | ICD-10-CM | POA: Diagnosis not present

## 2023-09-05 DIAGNOSIS — E039 Hypothyroidism, unspecified: Secondary | ICD-10-CM | POA: Diagnosis not present

## 2023-09-05 DIAGNOSIS — E7800 Pure hypercholesterolemia, unspecified: Secondary | ICD-10-CM | POA: Diagnosis not present

## 2023-09-05 DIAGNOSIS — F419 Anxiety disorder, unspecified: Secondary | ICD-10-CM

## 2023-09-05 DIAGNOSIS — E034 Atrophy of thyroid (acquired): Secondary | ICD-10-CM

## 2023-09-05 DIAGNOSIS — F3170 Bipolar disorder, currently in remission, most recent episode unspecified: Secondary | ICD-10-CM

## 2023-09-05 DIAGNOSIS — F319 Bipolar disorder, unspecified: Secondary | ICD-10-CM

## 2023-09-05 DIAGNOSIS — F5101 Primary insomnia: Secondary | ICD-10-CM

## 2023-09-05 DIAGNOSIS — I2510 Atherosclerotic heart disease of native coronary artery without angina pectoris: Secondary | ICD-10-CM | POA: Diagnosis not present

## 2023-09-05 DIAGNOSIS — K7581 Nonalcoholic steatohepatitis (NASH): Secondary | ICD-10-CM

## 2023-09-05 LAB — CBC WITH DIFFERENTIAL/PLATELET
Basophils Absolute: 0 10*3/uL (ref 0.0–0.1)
Basophils Relative: 0.9 % (ref 0.0–3.0)
Eosinophils Absolute: 0.2 10*3/uL (ref 0.0–0.7)
Eosinophils Relative: 4 % (ref 0.0–5.0)
HCT: 38.8 % (ref 36.0–46.0)
Hemoglobin: 12.9 g/dL (ref 12.0–15.0)
Lymphocytes Relative: 29 % (ref 12.0–46.0)
Lymphs Abs: 1.2 10*3/uL (ref 0.7–4.0)
MCHC: 33.4 g/dL (ref 30.0–36.0)
MCV: 88.7 fl (ref 78.0–100.0)
Monocytes Absolute: 0.3 10*3/uL (ref 0.1–1.0)
Monocytes Relative: 6.8 % (ref 3.0–12.0)
Neutro Abs: 2.5 10*3/uL (ref 1.4–7.7)
Neutrophils Relative %: 59.3 % (ref 43.0–77.0)
Platelets: 147 10*3/uL — ABNORMAL LOW (ref 150.0–400.0)
RBC: 4.37 Mil/uL (ref 3.87–5.11)
RDW: 13.6 % (ref 11.5–15.5)
WBC: 4.2 10*3/uL (ref 4.0–10.5)

## 2023-09-05 LAB — LIPID PANEL
Cholesterol: 132 mg/dL (ref 0–200)
HDL: 48.4 mg/dL (ref >39.00)
LDL Cholesterol: 54 mg/dL (ref 0–99)
NonHDL: 83.78
Total CHOL/HDL Ratio: 3
Triglycerides: 151 mg/dL — ABNORMAL HIGH (ref 0.0–149.0)
VLDL: 30.2 mg/dL (ref 0.0–40.0)

## 2023-09-05 LAB — COMPREHENSIVE METABOLIC PANEL WITH GFR
ALT: 68 U/L — ABNORMAL HIGH (ref 0–35)
AST: 63 U/L — ABNORMAL HIGH (ref 0–37)
Albumin: 4.5 g/dL (ref 3.5–5.2)
Alkaline Phosphatase: 56 U/L (ref 39–117)
BUN: 13 mg/dL (ref 6–23)
CO2: 29 meq/L (ref 19–32)
Calcium: 9.9 mg/dL (ref 8.4–10.5)
Chloride: 103 meq/L (ref 96–112)
Creatinine, Ser: 0.75 mg/dL (ref 0.40–1.20)
GFR: 78.25 mL/min (ref >60.00)
Glucose, Bld: 97 mg/dL (ref 70–99)
Potassium: 4.3 meq/L (ref 3.5–5.1)
Sodium: 138 meq/L (ref 135–145)
Total Bilirubin: 0.9 mg/dL (ref 0.2–1.2)
Total Protein: 6.9 g/dL (ref 6.0–8.3)

## 2023-09-05 LAB — TSH: TSH: 0.88 u[IU]/mL (ref 0.35–5.50)

## 2023-09-05 MED ORDER — ARIPIPRAZOLE 2 MG PO TABS: 2.0000 mg | ORAL_TABLET | Freq: Every day | ORAL | 1 refills | Status: DC

## 2023-09-05 MED ORDER — TRAZODONE HCL 50 MG PO TABS: 25.0000 mg | ORAL_TABLET | Freq: Every evening | ORAL | 3 refills | Status: DC | PRN

## 2023-09-05 MED ORDER — EZETIMIBE 10 MG PO TABS: 10.0000 mg | ORAL_TABLET | Freq: Every day | ORAL | 3 refills | Status: AC

## 2023-09-05 MED ORDER — ROSUVASTATIN CALCIUM 10 MG PO TABS
10.0000 mg | ORAL_TABLET | Freq: Every day | ORAL | 1 refills | Status: DC
Start: 2023-09-05 — End: 2024-03-18

## 2023-09-05 MED ORDER — AMLODIPINE BESYLATE 5 MG PO TABS: 5.0000 mg | ORAL_TABLET | Freq: Every day | ORAL | 1 refills | Status: DC

## 2023-09-05 MED ORDER — SERTRALINE HCL 100 MG PO TABS: 100.0000 mg | ORAL_TABLET | Freq: Every day | ORAL | 1 refills | Status: DC

## 2023-09-05 MED ORDER — LEVOTHYROXINE SODIUM 100 MCG PO TABS: 100.0000 ug | ORAL_TABLET | Freq: Every day | ORAL | 1 refills | Status: DC

## 2023-09-05 NOTE — Patient Instructions (Addendum)
 It was very nice to see you today!  Try taking abilify  earlier.   Try meditation.   Try trazodone for sleep   PLEASE NOTE:  If you had any lab tests please let us  know if you have not heard back within a few days. You may see your results on MyChart before we have a chance to review them but we will give you a call once they are reviewed by us . If we ordered any referrals today, please let us  know if you have not heard from their office within the next week.   Please try these tips to maintain a healthy lifestyle:  Eat most of your calories during the day when you are active. Eliminate processed foods including packaged sweets (pies, cakes, cookies), reduce intake of potatoes, white bread, white pasta, and white rice. Look for whole grain options, oat flour or almond flour.  Each meal should contain half fruits/vegetables, one quarter protein, and one quarter carbs (no bigger than a computer mouse).  Cut down on sweet beverages. This includes juice, soda, and sweet tea. Also watch fruit intake, though this is a healthier sweet option, it still contains natural sugar! Limit to 3 servings daily.  Drink at least 1 glass of water with each meal and aim for at least 8 glasses per day  Exercise at least 150 minutes every week.

## 2023-09-05 NOTE — Progress Notes (Signed)
 Liver tests a little worse-is she willing to see GI?

## 2023-09-05 NOTE — Progress Notes (Signed)
 Subjective:     Patient ID: Rebecca Lutz, female    DOB: 1951-05-01, 73 y.o.   MRN: [id number]  Chief Complaint  Patient presents with   Medical Management of Chronic Issues    6 month follow-up Fasting     HPI  HYPERTENSION-Pt is on metoprolol  75,amlodipine  5.  Bp's running 130/60.  No ha/dizziness/cp/palp/edema/cough/sob  Elevated LFT-not working on diet/exercise. Hypothyroidism-on 100mcg.  Taking correctly. No complaints  HLD-on zetia  and crestor  10-doing well Bipolar-on Abilify  and Zoloft .  Doing well.  No SI.  Sleep -nothing new. Discussed the use of AI scribe software for clinical note transcription with the patient, who gave verbal consent to proceed.  History of Present Illness Rebecca Lutz "Rebecca Lutz" is a 73 year old female who presents with difficulty falling asleep.  She experiences ongoing sleep disturbances, characterized by difficulty falling asleep at night, often not until 2 or 3 AM, despite being able to fall asleep easily during the day. She wakes up after 4 to 5 hours of sleep and has experienced this pattern for a long time. She attributes some of her sleep issues to childhood trauma involving her father's nighttime rages due to alcoholism. She has tried melatonin without success.  She has a history of hypertension and is currently taking metoprolol  75 mg and amlodipine  5 mg. Her blood pressure readings at home are consistent with those taken in the office, approximately 130/60 mmHg. No headaches, dizziness, chest pain, shortness of breath, heart racing, or leg swelling.  For hyperlipidemia, she is on Zetia  10 mg and Crestor  10 mg. She maintains regular follow-ups with her cardiologist annually and has a history of a heart attack, which was attributed to stress rather than coronary artery disease. She does not have stents and manages her condition with medication and lifestyle modifications.  Her bipolar disorder is managed with Abilify  2 mg and  sertraline  100 mg, which has stabilized her mood significantly, eliminating depressive thoughts and maintaining a steady mood. She is not currently in counseling and has not experienced manic episodes or suicidal thoughts.  She has a history of sleep apnea but has discontinued using her CPAP machine due to discomfort and feelings of claustrophobia, which have developed over the past six months. She has tried different mask options without success.  She also has a history of fatty liver, which is being monitored. She is not diabetic and does not have any new surgeries since her last visit.  Her current medication regimen includes levothyroxine  100 mcg for thyroid  management, taken in the morning, and she takes her other medications, including supplements, at various times throughout the day.    Health Maintenance Due  Topic Date Due   Medicare Annual Wellness (AWV)  10/13/2023    Past Medical History:  Diagnosis Date   Anxiety    Asthma    Bipolar disorder (HCC)    CAD (coronary artery disease)    MI 2007   Depression    Heart attack (HCC)    High cholesterol    Hypertension    Idiopathic urticaria    NASH (nonalcoholic steatohepatitis)    OSA on CPAP    Osteoporosis    previosly treated with bonivas   Peripheral neuropathy    Plantar fasciitis    Sciatica    Thyroid  disease     Past Surgical History:  Procedure Laterality Date   APPENDECTOMY     DILATION AND CURETTAGE OF UTERUS     idiopathic urticaria  LEG SURGERY Right 2020   poss achilles for plantar fasciitis   NASH     osteoporosis     TONSILLECTOMY       Current Outpatient Medications:    aspirin EC 81 MG tablet, Take 81 mg by mouth daily., Disp: , Rfl:    metoprolol  succinate (TOPROL -XL) 50 MG 24 hr tablet, Take 1.5 tablets (75 mg total) by mouth daily., Disp: 135 tablet, Rfl: 3   Misc Natural Products (LUTEIN VISION BLEND PO), Take by mouth daily., Disp: , Rfl:    polyethylene glycol powder  (GLYCOLAX /MIRALAX ) 17 GM/SCOOP powder, Take 17 g by mouth in the morning and at bedtime., Disp: 3350 g, Rfl: 0   PREVIDENT 5000 BOOSTER PLUS 1.1 % PSTE, Place onto teeth., Disp: , Rfl:    traZODone (DESYREL) 50 MG tablet, Take 0.5-1 tablets (25-50 mg total) by mouth at bedtime as needed for sleep., Disp: 30 tablet, Rfl: 3   UNABLE TO FIND, at bedtime. CPAP, Disp: , Rfl:    amLODipine  (NORVASC ) 5 MG tablet, Take 1 tablet (5 mg total) by mouth daily., Disp: 90 tablet, Rfl: 1   ARIPiprazole  (ABILIFY ) 2 MG tablet, Take 1 tablet (2 mg total) by mouth daily., Disp: 90 tablet, Rfl: 1   ezetimibe  (ZETIA ) 10 MG tablet, Take 1 tablet (10 mg total) by mouth daily., Disp: 90 tablet, Rfl: 3   levothyroxine  (SYNTHROID ) 100 MCG tablet, Take 1 tablet (100 mcg total) by mouth daily., Disp: 90 tablet, Rfl: 1   rosuvastatin  (CRESTOR ) 10 MG tablet, Take 1 tablet (10 mg total) by mouth daily., Disp: 90 tablet, Rfl: 1   sertraline  (ZOLOFT ) 100 MG tablet, Take 1 tablet (100 mg total) by mouth daily., Disp: 90 tablet, Rfl: 1  Allergies  Allergen Reactions   Penicillins Rash   ROS neg/noncontributory except as noted HPI/below Eyes puffy in am for several months. Does wear CPAP.      Objective:     BP 138/65   Pulse (!) 55   Temp (!) 97.2 F (36.2 C) (Temporal)   Resp 16   Ht 5\' 1"  (1.549 m)   Wt 159 lb (72.1 kg)   SpO2 94%   BMI 30.04 kg/m  Wt Readings from Last 3 Encounters:  09/05/23 159 lb (72.1 kg)  07/13/23 161 lb (73 kg)  03/09/23 168 lb 6 oz (76.4 kg)    Physical Exam   Gen: WDWN NAD HEENT: NCAT, conjunctiva not injected, sclera nonicteric NECK:  supple, no thyromegaly, no nodes, no carotid bruits CARDIAC: RRR, S1S2+, no murmur. DP 2+B LUNGS: CTAB. No wheezes ABDOMEN:  BS+, soft, NTND, No HSM, no masses EXT:  no edema MSK: no gross abnormalities.  NEURO: A&O x3.  CN II-XII intact.  PSYCH: normal mood. Good eye contact     Assessment & Plan:  Essential hypertension -      amLODIPine  Besylate; Take 1 tablet (5 mg total) by mouth daily.  Dispense: 90 tablet; Refill: 1 -     Comprehensive metabolic panel with GFR -     CBC with Differential/Platelet  Pure hypercholesterolemia -     Ezetimibe ; Take 1 tablet (10 mg total) by mouth daily.  Dispense: 90 tablet; Refill: 3 -     Rosuvastatin  Calcium ; Take 1 tablet (10 mg total) by mouth daily.  Dispense: 90 tablet; Refill: 1 -     Lipid panel -     Comprehensive metabolic panel with GFR  Acquired hypothyroidism -     TSH  Coronary  artery disease involving native coronary artery of native heart without angina pectoris  Bipolar affective disorder in remission (HCC)  Primary insomnia -     traZODone HCl; Take 0.5-1 tablets (25-50 mg total) by mouth at bedtime as needed for sleep.  Dispense: 30 tablet; Refill: 3  NASH (nonalcoholic steatohepatitis)  Anxiety -     ARIPiprazole ; Take 1 tablet (2 mg total) by mouth daily.  Dispense: 90 tablet; Refill: 1 -     Sertraline  HCl; Take 1 tablet (100 mg total) by mouth daily.  Dispense: 90 tablet; Refill: 1  Bipolar depression (HCC) -     ARIPiprazole ; Take 1 tablet (2 mg total) by mouth daily.  Dispense: 90 tablet; Refill: 1 -     Sertraline  HCl; Take 1 tablet (100 mg total) by mouth daily.  Dispense: 90 tablet; Refill: 1  Hypothyroidism due to acquired atrophy of thyroid  -     Levothyroxine  Sodium; Take 1 tablet (100 mcg total) by mouth daily.  Dispense: 90 tablet; Refill: 1  Assessment and Plan Assessment & Plan maintaining an active lifestyle through walking, gym equipment use, and taking stairs.   Insomnia   She experiences difficulty falling asleep until 2-3 AM, possibly linked to a history of childhood trauma. Melatonin was ineffective. Discussed using trazodone to reset her sleep pattern, adjusting her medication schedule earlier, and trying meditation and relaxation techniques. She will try a 30-day supply of trazodone to assess its effectiveness, consider  moving her medication schedule earlier in the evening, and try meditation and relaxation techniques.  Obstructive sleep apnea   She is not using CPAP due to discomfort and claustrophobia, having tried different masks without success.  Hypertension   Her blood pressure is well-controlled  on metoprolol  75 mg and amlodipine  5 mg. Refill amlodipine  prescription.  Hyperlipidemia   She is on Zetia  10 mg and Crestor  10 mg with no new issues reported. Chronic.  controlled  Hypothyroidism   She is on levothyroxine  100 mcg with no new issues reported. Refill levothyroxine  prescription.  Fatty liver   Her condition is monitored with no new issues reported.  Myocardial infarction   She had a previous myocardial infarction attributed to stress with no new cardiac symptoms. She sees a cardiologist annually.  Bipolar disorder   Her mood is stable on Abilify  2 mg and sertraline  100 mg, with no depressive thoughts or manic episodes. She is aware to seek psychiatric help if the regimen becomes ineffective. Refill Abilify  prescription.     Ellsworth Haas, MD

## 2023-09-06 ENCOUNTER — Other Ambulatory Visit: Payer: Self-pay | Admitting: *Deleted

## 2023-09-06 DIAGNOSIS — R7989 Other specified abnormal findings of blood chemistry: Secondary | ICD-10-CM

## 2023-09-13 DIAGNOSIS — G4733 Obstructive sleep apnea (adult) (pediatric): Secondary | ICD-10-CM | POA: Diagnosis not present

## 2023-10-04 ENCOUNTER — Other Ambulatory Visit: Payer: Self-pay | Admitting: *Deleted

## 2023-10-04 MED ORDER — DOXEPIN HCL 3 MG PO TABS: 3.0000 mg | ORAL_TABLET | Freq: Every day | ORAL | 0 refills | Status: DC

## 2023-10-16 DIAGNOSIS — Z7982 Long term (current) use of aspirin: Secondary | ICD-10-CM | POA: Diagnosis not present

## 2023-10-16 DIAGNOSIS — D6869 Other thrombophilia: Secondary | ICD-10-CM | POA: Diagnosis not present

## 2023-10-19 ENCOUNTER — Ambulatory Visit: Payer: Medicare Other

## 2023-10-19 VITALS — Ht 60.0 in | Wt 159.0 lb

## 2023-10-19 DIAGNOSIS — Z0000 Encounter for general adult medical examination without abnormal findings: Secondary | ICD-10-CM

## 2023-10-19 NOTE — Patient Instructions (Signed)
 Ms. Balzarini , Thank you for taking time out of your busy schedule to complete your Annual Wellness Visit with me. I enjoyed our conversation and look forward to speaking with you again next year. I, as well as your care team,  appreciate your ongoing commitment to your health goals. Please review the following plan we discussed and let me know if I can assist you in the future. Your Game plan/ To Do List    Referrals: If you haven't heard from the office you've been referred to, please reach out to them at the phone provided.   Follow up Visits: Next Medicare AWV with our clinical staff: 10/24/24   Have you seen your provider in the last 6 months (3 months if uncontrolled diabetes)? Yes Next Office Visit with your provider: 02/2024  Clinician Recommendations:  Aim for 30 minutes of exercise or brisk walking, 6-8 glasses of water, and 5 servings of fruits and vegetables each day.       This is a list of the screening recommended for you and due dates:  Health Maintenance  Topic Date Due   Yearly kidney health urinalysis for diabetes  12/15/2023*   COVID-19 Vaccine (9 - 2024-25 season) 12/17/2023*   Flu Shot  12/08/2023   Yearly kidney function blood test for diabetes  09/04/2024   Medicare Annual Wellness Visit  10/18/2024   Mammogram  06/21/2025   Colon Cancer Screening  05/07/2028   DTaP/Tdap/Td vaccine (4 - Td or Tdap) 09/22/2030   Pneumococcal Vaccine for age over 12  Completed   DEXA scan (bone density measurement)  Completed   Hepatitis C Screening  Completed   Zoster (Shingles) Vaccine  Completed   HPV Vaccine  Aged Out   Meningitis B Vaccine  Aged Out   Complete foot exam   Discontinued   Hemoglobin A1C  Discontinued   Eye exam for diabetics  Discontinued  *Topic was postponed. The date shown is not the original due date.    Advanced directives: (In Chart) A copy of your advanced directives are scanned into your chart should your provider ever need it. Advance Care  Planning is important because it:  [x]  Makes sure you receive the medical care that is consistent with your values, goals, and preferences  [x]  It provides guidance to your family and loved ones and reduces their decisional burden about whether or not they are making the right decisions based on your wishes.  Follow the link provided in your after visit summary or read over the paperwork we have mailed to you to help you started getting your Advance Directives in place. If you need assistance in completing these, please reach out to us  so that we can help you!  See attachments for Preventive Care and Fall Prevention Tips.

## 2023-10-19 NOTE — Progress Notes (Signed)
 Subjective:   Rebecca Lutz is a 73 y.o. who presents for a Medicare Wellness preventive visit.  As a reminder, Annual Wellness Visits don't include a physical exam, and some assessments may be limited, especially if this visit is performed virtually. We may recommend an in-person follow-up visit with your provider if needed.  Visit Complete: Virtual I connected with  Rebecca Lutz on 10/19/23 by a audio enabled telemedicine application and verified that I am speaking with the correct person using two identifiers.  Patient Location: Home  Provider Location: Office/Clinic  I discussed the limitations of evaluation and management by telemedicine. The patient expressed understanding and agreed to proceed.  Vital Signs: Because this visit was a virtual/telehealth visit, some criteria may be missing or patient reported. Any vitals not documented were not able to be obtained and vitals that have been documented are patient reported.  VideoDeclined- This patient declined Librarian, academic. Therefore the visit was completed with audio only.  Persons Participating in Visit: Patient.  AWV Questionnaire: No: Patient Medicare AWV questionnaire was not completed prior to this visit.  Cardiac Risk Factors include: advanced age (>35men, >67 women);dyslipidemia;hypertension     Objective:    Today's Vitals   10/19/23 1427  Weight: 159 lb (72.1 kg)  Height: 5' (1.524 m)   Body mass index is 31.05 kg/m.     10/19/2023    2:32 PM 10/13/2022    3:38 PM 06/07/2021    9:39 AM 05/28/2020    9:10 AM 10/16/2019    9:20 AM  Advanced Directives  Does Patient Have a Medical Advance Directive? Yes No Yes Yes Yes  Type of Estate agent of [place];Living will  Healthcare Power of [place];Living will Healthcare Power of [place];Living will Healthcare Power of [place];Living will  Does patient want to make changes to medical advance directive? No  - Patient declined      Copy of Healthcare Power of Attorney in Chart? Yes - validated most recent copy scanned in chart (See row information)  Yes - validated most recent copy scanned in chart (See row information) No - copy requested   Would patient like information on creating a medical advance directive?  Yes (MAU/Ambulatory/Procedural Areas - Information given)       Current Medications (verified) Outpatient Encounter Medications as of 10/19/2023  Medication Sig   amLODipine  (NORVASC ) 5 MG tablet Take 1 tablet (5 mg total) by mouth daily.   ARIPiprazole  (ABILIFY ) 2 MG tablet Take 1 tablet (2 mg total) by mouth daily.   aspirin EC 81 MG tablet Take 81 mg by mouth daily.   Doxepin  HCl 3 MG TABS Take 1 tablet (3 mg total) by mouth at bedtime.   ezetimibe  (ZETIA ) 10 MG tablet Take 1 tablet (10 mg total) by mouth daily.   levothyroxine  (SYNTHROID ) 100 MCG tablet Take 1 tablet (100 mcg total) by mouth daily.   metoprolol  succinate (TOPROL -XL) 50 MG 24 hr tablet Take 1.5 tablets (75 mg total) by mouth daily.   Misc Natural Products (LUTEIN VISION BLEND PO) Take by mouth daily.   polyethylene glycol powder (GLYCOLAX /MIRALAX ) 17 GM/SCOOP powder Take 17 g by mouth in the morning and at bedtime.   PREVIDENT 5000 BOOSTER PLUS 1.1 % PSTE Place onto teeth.   rosuvastatin  (CRESTOR ) 10 MG tablet Take 1 tablet (10 mg total) by mouth daily.   sertraline  (ZOLOFT ) 100 MG tablet Take 1 tablet (100 mg total) by mouth daily.   UNABLE TO FIND  at bedtime. CPAP   traZODone  (DESYREL ) 50 MG tablet Take 0.5-1 tablets (25-50 mg total) by mouth at bedtime as needed for sleep.   No facility-administered encounter medications on file as of 10/19/2023.    Allergies (verified) Penicillins   History: Past Medical History:  Diagnosis Date   Anxiety    Asthma    Bipolar disorder (HCC)    CAD (coronary artery disease)    MI 2007   Depression    Heart attack (HCC)    High cholesterol    Hypertension     Idiopathic urticaria    NASH (nonalcoholic steatohepatitis)    OSA on CPAP    Osteoporosis    previosly treated with bonivas   Peripheral neuropathy    Plantar fasciitis    Sciatica    Thyroid  disease    Past Surgical History:  Procedure Laterality Date   APPENDECTOMY     DILATION AND CURETTAGE OF UTERUS     idiopathic urticaria     LEG SURGERY Right 2020   poss achilles for plantar fasciitis   NASH     osteoporosis     TONSILLECTOMY     Family History  Problem Relation Age of Onset   Lung cancer Mother    COPD Mother    Heart attack Mother    Heart failure Mother    Suicidality Father        ? bipolar disorder   Depression Father    Cancer Daughter    Social History   Socioeconomic History   Marital status: Widowed    Spouse name: Not on file   Number of children: 3   Years of education: Not on file   Highest education level: Some college, no degree  Occupational History   Not on file  Tobacco Use   Smoking status: Never    Passive exposure: Never   Smokeless tobacco: Never  Vaping Use   Vaping status: Never Used  Substance and Sexual Activity   Alcohol use: Yes    Comment: occasional   Drug use: Never   Sexual activity: Not Currently  Other Topics Concern   Not on file  Social History Narrative   2 sons, 1 daughter (all in [place])-5 grands   Widowed 2020   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   Completed some college   Enjoys yard work, spending time with her grandchildren, painting, coloring and needlepoint   Lives in independent living at [facility]   Social Drivers of Health   Financial Resource Strain: Low Risk  (10/19/2023)   Overall Financial Resource Strain (CARDIA)    Difficulty of Paying Living Expenses: Not hard at all  Food Insecurity: No Food Insecurity (10/19/2023)   Hunger Vital Sign    Worried About Running Out of Food in the Last Year: Never true    Ran Out of Food in the Last Year: Never  true  Transportation Needs: No Transportation Needs (10/19/2023)   PRAPARE - Administrator, Civil Service (Medical): No    Lack of Transportation (Non-Medical): No  Physical Activity: Insufficiently Active (10/19/2023)   Exercise Vital Sign    Days of Exercise per Week: 5 days    Minutes of Exercise per Session: 10 min  Stress: No Stress Concern Present (10/19/2023)   [employer] of Occupational Health - Occupational Stress Questionnaire    Feeling of Stress: Not at all  Recent Concern: Stress - Stress Concern  Present (08/29/2023)   [employer] of Occupational Health - Occupational Stress Questionnaire    Feeling of Stress : To some extent  Social Connections: Moderately Integrated (10/19/2023)   Social Connection and Isolation Panel    Frequency of Communication with Friends and Family: More than three times a week    Frequency of Social Gatherings with Friends and Family: More than three times a week    Attends Religious Services: More than 4 times per year    Active Member of [employer] or Organizations: Yes    Attends Banker Meetings: 1 to 4 times per year    Marital Status: Widowed    Tobacco Counseling Counseling given: Not Answered    Clinical Intake:  Pre-visit preparation completed: Yes  Pain : No/denies pain     BMI - recorded: 31.05 Nutritional Status: BMI > 30  Obese Nutritional Risks: None Diabetes: No  Lab Results  Component Value Date   HGBA1C 5.3 03/01/2023   HGBA1C 5.3 08/25/2022   HGBA1C 5.5 01/20/2022     How often do you need to have someone help you when you read instructions, pamphlets, or other written materials from your doctor or pharmacy?: 1 - Never  Interpreter Needed?: No  Information entered by :: Lamont Pilsner, LPN   Activities of Daily Living     10/19/2023    2:28 PM  In your present state of health, do you have any difficulty performing the following activities:  Hearing? 0  Vision? 0   Difficulty concentrating or making decisions? 0  Walking or climbing stairs? 0  Dressing or bathing? 0  Doing errands, shopping? 0  Preparing Food and eating ? N  Using the Toilet? N  In the past six months, have you accidently leaked urine? N  Do you have problems with loss of bowel control? N  Managing your Medications? N  Managing your Finances? N  Housekeeping or managing your Housekeeping? N    Patient Care Team: Christel Cousins, MD as PCP - General (Family Medicine)  I have updated your Care Teams any recent Medical Services you may have received from other providers in the past year.     Assessment:   This is a routine wellness examination for [name] Marie.  Hearing/Vision screen Hearing Screening - Comments:: Pt denies any hearing issues  Vision Screening - Comments:: Wears rx glasses - up to date with routine eye exams [facility]     Goals Addressed             This Visit's Progress    Patient Stated       Maintain health and activity        Depression Screen     10/19/2023    2:29 PM 03/01/2023    8:28 AM 10/13/2022    3:37 PM 08/25/2022    8:32 AM 06/07/2021    9:42 AM 11/10/2020    8:10 AM 05/28/2020    9:13 AM  PHQ 2/9 Scores  PHQ - 2 Score 0 0 0 0 0 0 1  PHQ- 9 Score  4 0 3       Fall Risk     10/19/2023    2:32 PM 03/01/2023    8:27 AM 10/10/2022    1:00 PM 08/25/2022    8:32 AM 02/23/2022    8:20 AM  Fall Risk   Falls in the past year? 0 0 0 0 0  Number falls in  past yr: 0 0 0 0 0  Injury with Fall? 0 0  0 0  Risk for fall due to : No Fall Risks No Fall Risks Impaired vision No Fall Risks No Fall Risks  Follow up Falls prevention discussed Falls evaluation completed Falls prevention discussed Falls evaluation completed Falls evaluation completed      Data saved with a previous flowsheet row definition    MEDICARE RISK AT HOME:  Medicare Risk at Home Any stairs in or around the home?: No If so, are there any without  handrails?: No Home free of loose throw rugs in walkways, pet beds, electrical cords, etc?: Yes Adequate lighting in your home to reduce risk of falls?: Yes Life alert?: Yes Use of a cane, walker or w/c?: No Grab bars in the bathroom?: Yes Shower chair or bench in shower?: No Elevated toilet seat or a handicapped toilet?: Yes  TIMED UP AND GO:  Was the test performed?  No  Cognitive Function: 6CIT completed        10/19/2023    2:33 PM 10/13/2022    3:40 PM  6CIT Screen  What Year? 0 points 0 points  What month? 0 points 0 points  What time? 0 points 0 points  Count back from 20 0 points 0 points  Months in reverse 0 points 0 points  Repeat phrase 0 points 0 points  Total Score 0 points 0 points    Immunizations Immunization History  Administered Date(s) Administered   Fluad Quad(high Dose 65+) 02/20/2019, 02/10/2021   Fluzone Influenza virus vaccine,trivalent (IIV3), split virus 03/21/2005, 03/03/2006, 02/19/2007, 01/18/2008, 02/02/2009, 02/29/2012, 01/20/2014, 02/10/2015   Hepatitis A 02/21/2013, 08/22/2013   Hepatitis B 02/21/2013, 03/21/2013, 06/13/2013   Influenza, High Dose Seasonal PF 02/28/2023   Influenza, Seasonal, Injecte, Preservative Fre 01/31/2013   Influenza,inj,Quad PF,6+ Mos 02/20/2019   Influenza-Unspecified 04/05/2000, 02/21/2001, 05/14/2003, 03/18/2004, 02/18/2020   Novel Infuenza-h1n1-09 03/31/2008   PFIZER(Purple Top)SARS-COV-2 Vaccination 06/23/2019, 07/16/2019, 03/04/2020, 11/10/2020   Pfizer Covid Bivalent Pediatric Vaccine(19mos to <48yrs) 11/06/2021   Pfizer Covid-19 Vaccine Bivalent Booster 71yrs & up 02/10/2021, 09/24/2021   Pneumococcal Conjugate-13 01/14/2014   Pneumococcal Polysaccharide-23 05/11/2020   Respiratory Syncytial Virus Vaccine,Recomb Aduvanted(Arexvy) 03/29/2023   Td 06/08/1990   Tdap 05/15/2007, 09/21/2020   Unspecified SARS-COV-2 Vaccination 02/22/2023   Zoster Recombinant(Shingrix) 01/30/2017, 04/01/2017   Zoster, Live  07/17/2012    Screening Tests Health Maintenance  Topic Date Due   Diabetic kidney evaluation - Urine ACR  12/15/2023 (Originally 01/21/2023)   COVID-19 Vaccine (9 - 2024-25 season) 12/17/2023 (Originally 08/23/2023)   INFLUENZA VACCINE  12/08/2023   Diabetic kidney evaluation - eGFR measurement  09/04/2024   Medicare Annual Wellness (AWV)  10/18/2024   MAMMOGRAM  06/21/2025   Colonoscopy  05/07/2028   DTaP/Tdap/Td (4 - Td or Tdap) 09/22/2030   Pneumococcal Vaccine: 50+ Years  Completed   DEXA SCAN  Completed   Hepatitis C Screening  Completed   Zoster Vaccines- Shingrix  Completed   HPV VACCINES  Aged Out   Meningococcal B Vaccine  Aged Out   FOOT EXAM  Discontinued   HEMOGLOBIN A1C  Discontinued   OPHTHALMOLOGY EXAM  Discontinued    Health Maintenance  There are no preventive care reminders to display for this patient.  Health Maintenance Items Addressed: See Nurse Notes at the end of this note  Additional Screening:  Vision Screening: Recommended annual ophthalmology exams for early detection of glaucoma and other disorders of the eye. Would you like a referral to  an eye doctor? No    Dental Screening: Recommended annual dental exams for proper oral hygiene  Community Resource Referral / Chronic Care Management: CRR required this visit?  No   CCM required this visit?  No   Plan:    I have personally reviewed and noted the following in the patient's chart:   Medical and social history Use of alcohol, tobacco or illicit drugs  Current medications and supplements including opioid prescriptions. Patient is not currently taking opioid prescriptions. Functional ability and status Nutritional status Physical activity Advanced directives List of other physicians Hospitalizations, surgeries, and ER visits in previous 12 months Vitals Screenings to include cognitive, depression, and falls Referrals and appointments  In addition, I have reviewed and discussed with  patient certain preventive protocols, quality metrics, and best practice recommendations. A written personalized care plan for preventive services as well as general preventive health recommendations were provided to patient.   Bruno Capri, LPN   2/84/1324   After Visit Summary: (MyChart) Due to this being a telephonic visit, the after visit summary with patients personalized plan was offered to patient via MyChart   Notes: Nothing significant to report at this time.

## 2023-10-23 ENCOUNTER — Encounter: Payer: Self-pay | Admitting: Gastroenterology

## 2023-10-30 ENCOUNTER — Encounter: Payer: Self-pay | Admitting: Family Medicine

## 2023-10-30 ENCOUNTER — Ambulatory Visit

## 2023-10-30 ENCOUNTER — Ambulatory Visit: Payer: Self-pay | Admitting: Family Medicine

## 2023-10-30 ENCOUNTER — Ambulatory Visit (INDEPENDENT_AMBULATORY_CARE_PROVIDER_SITE_OTHER): Admitting: Family Medicine

## 2023-10-30 VITALS — BP 130/66 | HR 62 | Temp 97.5°F | Resp 16 | Ht 60.0 in | Wt 155.4 lb

## 2023-10-30 DIAGNOSIS — R269 Unspecified abnormalities of gait and mobility: Secondary | ICD-10-CM

## 2023-10-30 DIAGNOSIS — M25551 Pain in right hip: Secondary | ICD-10-CM | POA: Diagnosis not present

## 2023-10-30 DIAGNOSIS — R202 Paresthesia of skin: Secondary | ICD-10-CM | POA: Diagnosis not present

## 2023-10-30 LAB — VITAMIN B12: Vitamin B-12: 846 pg/mL (ref 211–911)

## 2023-10-30 NOTE — Patient Instructions (Signed)
[facility]  [address] on the 1st floor Phone number [phone]  

## 2023-10-30 NOTE — Progress Notes (Signed)
 Subjective:     Patient ID: Rebecca Lutz, female    DOB: 1950-08-22, 73 y.o.   MRN: [id number]  Chief Complaint  Patient presents with   Gait Issues    Having trouble walking, shaky and off balance, noticed a while ago Feet feel like they are made out of sandpaper and really hot at times    HPI Discussed the use of AI scribe software for clinical note transcription with the patient, who gave verbal consent to proceed.  History of Present Illness Rebecca Lutz is a 73 year old female who presents with balance issues and changes in gait.  She has been experiencing balance issues and changes in her gait for several years- getting worse. Her feet feel like 'sandpaper', indicating a change in sensation. Her daughter noticed a significant change in her gait during a vacation, observing that she veers to the right when walking-has been doing long time but worse. She also experiences a sensation of falling backwards when turning quickly, although she has not actually fallen backwards. Recently, she fell onto her knees after catching her foot on the edge of a sofa, resulting in pain in the right knee where she landed on a board. She mentions that she used to fall more frequently in the past.  She has been taking B12 supplements after previously having low B12 levels. She recalls having blood work done in April 2023, which showed elevated liver tests, a condition she has been living with for some time. She has a history of a heart attack in 2007, during which a catheterization was performed through the right groin. She reports soreness in the right groin area over the past few months and recalls that a catheterization was performed through that area during her heart attack in 2007.  No dizziness, lightheadedness, slurred speech, or weakness. She does not have a family history of diagnosed neuropathy, although her mother experienced similar symptoms with her feet. She reports pain  in the left SI joint and right groin, which started in the last two to three months. She also mentions having hammer toes and periodic tremors.no DM  She lives at [facility], where she has access to physical therapy services.    There are no preventive care reminders to display for this patient.  Past Medical History:  Diagnosis Date   Allergy 1956   allergy shots five yrs   Anxiety    Asthma 1956   Bipolar disorder (HCC)    CAD (coronary artery disease)    MI 2007   Cataract    Depression 1993   Heart attack (HCC)    Heart murmur    High cholesterol    Hypertension    Idiopathic urticaria    NASH (nonalcoholic steatohepatitis)    OSA on CPAP    Osteoporosis    previosly treated with bonivas   Peripheral neuropathy    Plantar fasciitis    Sciatica    Sleep apnea    Thyroid  disease     Past Surgical History:  Procedure Laterality Date   APPENDECTOMY  1963   DILATION AND CURETTAGE OF UTERUS     idiopathic urticaria     LEG SURGERY Right 2020   poss achilles for plantar fasciitis   NASH     osteoporosis     TONSILLECTOMY       Current Outpatient Medications:    amLODipine  (NORVASC ) 5 MG tablet, Take 1 tablet (5 mg total) by mouth daily., Disp: 90  tablet, Rfl: 1   ARIPiprazole  (ABILIFY ) 2 MG tablet, Take 1 tablet (2 mg total) by mouth daily., Disp: 90 tablet, Rfl: 1   aspirin EC 81 MG tablet, Take 81 mg by mouth daily., Disp: , Rfl:    Doxepin  HCl 3 MG TABS, Take 1 tablet (3 mg total) by mouth at bedtime., Disp: 30 tablet, Rfl: 0   ezetimibe  (ZETIA ) 10 MG tablet, Take 1 tablet (10 mg total) by mouth daily., Disp: 90 tablet, Rfl: 3   levothyroxine  (SYNTHROID ) 100 MCG tablet, Take 1 tablet (100 mcg total) by mouth daily., Disp: 90 tablet, Rfl: 1   metoprolol  succinate (TOPROL -XL) 50 MG 24 hr tablet, Take 1.5 tablets (75 mg total) by mouth daily., Disp: 135 tablet, Rfl: 3   Misc Natural Products (LUTEIN VISION BLEND PO), Take by mouth daily., Disp: , Rfl:     polyethylene glycol powder (GLYCOLAX /MIRALAX ) 17 GM/SCOOP powder, Take 17 g by mouth in the morning and at bedtime., Disp: 3350 g, Rfl: 0   PREVIDENT 5000 BOOSTER PLUS 1.1 % PSTE, Place onto teeth., Disp: , Rfl:    rosuvastatin  (CRESTOR ) 10 MG tablet, Take 1 tablet (10 mg total) by mouth daily., Disp: 90 tablet, Rfl: 1   sertraline  (ZOLOFT ) 100 MG tablet, Take 1 tablet (100 mg total) by mouth daily., Disp: 90 tablet, Rfl: 1   UNABLE TO FIND, at bedtime. CPAP, Disp: , Rfl:   Allergies  Allergen Reactions   Penicillins Rash   ROS neg/noncontributory except as noted HPI/below      Objective:     BP 130/66   Pulse 62   Temp (!) 97.5 F (36.4 C) (Temporal)   Resp 16   Ht 5' (1.524 m)   Wt 155 lb 6 oz (70.5 kg)   SpO2 95%   BMI 30.34 kg/m  Wt Readings from Last 3 Encounters:  10/30/23 155 lb 6 oz (70.5 kg)  10/19/23 159 lb (72.1 kg)  09/05/23 159 lb (72.1 kg)    Physical Exam   Gen: WDWN NAD HEENT: NCAT, conjunctiva not injected, sclera nonicteric ABDOMEN:  BS+, soft, NTND, No HSM, no masses EXT:  no edema MSK: no gross abnormalities, but when turns direction while walking, feels off.  Rebecca Lutz  NEURO: A&O x3.  CN II-XII intact.  Fine finger tremors.  F-n,ram, pronator drift neg.  Babinski neg PSYCH: normal mood. Good eye contact Back:  can stand on toes/1 leg but not heels.  No TTP. No SI tenderness. No rash.  MS 5/5 BLE except great toes, can't dorsiflex.  DTR flat+ BLE.  SLR neg B.  Good ROM .  +hammer or curled toes.  R hip decreased rom-no pain.      Assessment & Plan:  Paresthesia -     Vitamin B12 -     DG Lumbar Spine 2-3 Views; Future -     DG HIP UNILAT W OR W/O PELVIS 2-3 VIEWS RIGHT; Future  Gait abnormality -     Vitamin B12 -     Ambulatory referral to Physical Therapy -     DG Lumbar Spine 2-3 Views; Future -     DG HIP UNILAT W OR W/O PELVIS 2-3 VIEWS RIGHT; Future  Pain of right hip -     DG HIP UNILAT W OR W/O PELVIS 2-3 VIEWS RIGHT;  Future  Assessment and Plan Assessment & Plan Gait disturbance and balance issues   She reports long-standing abnormal sensations in her feet, described as sandpaper-like, and a change  in gait over several years. Recently, she has experienced veering to the right while walking and a sensation of falling backward when turning quickly. A recent fall resulted in her right knee hitting a board and the left knee hitting the floor. There is no dizziness or lightheadedness. Potential causes include neuropathy, vitamin B12 deficiency, spinal issues,stroke or idiopathic causes. Her B12 was previously low, and she is currently taking B12 supplements-hasn't been checked since 2023. Hammer toes and weakness in dorsiflexion of the toes suggest possible neuropathy. , initial evaluation by sports medicine is preferred to determine if the neuropathy is from the spine, hips, or other causes. Had normal MRI brain in Oct 2024.  Order x-rays to evaluate spine and hips. Refer to sports medicine for further evaluation of neuropathy and gait issues. Check vitamin B12 level. Refer to physical therapy at [facility].  Right groin soreness   She reports soreness in the right groin area, present for the last two to three months,  The soreness is not associated with specific movements or activities.  Could be OA or other  Chronic liver enzyme elevation   She has elevated liver enzymes, consistently high, with an upcoming gastroenterology appointment on August 1st. Previous evaluations, including ultrasounds, have suggested fatty liver as a possible cause. Follow up with gastroenterology appointment on August 1st.    Return if symptoms worsen or fail to improve.  Jenkins CHRISTELLA Carrel, MD

## 2023-10-30 NOTE — Progress Notes (Signed)
B12 is fine

## 2023-10-31 ENCOUNTER — Other Ambulatory Visit: Payer: Self-pay | Admitting: Family Medicine

## 2023-10-31 NOTE — Telephone Encounter (Signed)
 Patient stated it does not work well, did not request refill, stated let's not refill it and if I feel I need something, I will call and request something different.

## 2023-11-06 ENCOUNTER — Ambulatory Visit: Admitting: Family Medicine

## 2023-11-07 NOTE — Progress Notes (Signed)
 Some arthritis in spine.  Hip ok.  Glad you will see Dr. Joane next week

## 2023-11-14 ENCOUNTER — Ambulatory Visit (INDEPENDENT_AMBULATORY_CARE_PROVIDER_SITE_OTHER): Admitting: Family Medicine

## 2023-11-14 ENCOUNTER — Other Ambulatory Visit: Payer: Self-pay

## 2023-11-14 VITALS — BP 122/72 | HR 63 | Ht 60.0 in | Wt 157.0 lb

## 2023-11-14 DIAGNOSIS — M79672 Pain in left foot: Secondary | ICD-10-CM | POA: Diagnosis not present

## 2023-11-14 DIAGNOSIS — M79671 Pain in right foot: Secondary | ICD-10-CM

## 2023-11-14 DIAGNOSIS — R2689 Other abnormalities of gait and mobility: Secondary | ICD-10-CM | POA: Diagnosis not present

## 2023-11-14 MED ORDER — GABAPENTIN 100 MG PO CAPS: 100.0000 mg | ORAL_CAPSULE | Freq: Three times a day (TID) | ORAL | 3 refills | Status: DC | PRN

## 2023-11-14 NOTE — Progress Notes (Signed)
 [name], PhD, LAT, ATC acting as a scribe for Artist Lloyd, MD.  Rebecca Lutz is a 73 y.o. female who presents to [facility] at [facility] today for bilat foot pain ongoing for years. She describes the feeling like sand-paper rubbing together like a burning pain. Pt locates pain to in the arch of her feet. She also notes some balance and gait issues. When she is walking in a straight line, she tends to veer off to the R-side.   Aggravates: constant pain Treatments tried: Vitamin B,   Pertinent review of systems: No fevers or chills  Relevant historical information: Hypertension Patient does have peripheral neuropathy listed on her problem list but she is unaware of when that was diagnosed or how it was diagnosed.  Exam:  BP 122/72   Pulse 63   Ht 5' (1.524 m)   Wt 157 lb (71.2 kg)   SpO2 96%   BMI 30.66 kg/m  General: Well Developed, well nourished, and in no acute distress.   MSK: Bilateral lower extremities normal-appearing No edema. Strength mildly decreased bilateral foot dorsiflexion but otherwise intact. Reflexes are intact. Sensation is decreased to light touch and monofilament plantar feet bilaterally.    Lab and Radiology Results  EXAM: LUMBAR SPINE - 2-3 VIEW   COMPARISON:  None Available.   FINDINGS: There is no evidence of lumbar spine fracture. Scoliosis. Intervertebral disc spaces are maintained. Mild anterior spurring noted T11, T12, L1 and L2. Mild facet joint sclerosis noted in the lower lumbar spine. Gallstones are noted the gallbladder.   IMPRESSION: 1. Mild degenerative joint changes of lumbar spine. 2. Cholelithiasis.     Electronically Signed   By: Craig Farr M.D.   On: 11/03/2023 14:00 I, Artist Lloyd, personally (independently) visualized and performed the interpretation of the images attached in this note.    Assessment and Plan: 73 y.o. female with bilateral lower extremity paresthesias and  decreased sensation.  Most likely diagnosis is peripheral neuropathy.  Lumbar radiculopathy is a possibility but is less likely.  Plan for nerve conduction study.  Additionally will refer to physical therapy for fall prevention and gait training for some impaired balance.  Recommend against using bifocals and making sure she has ample lighting when she is walking at night. Additionally use limited gabapentin  especially for painful paresthesias.   PDMP not reviewed this encounter. Orders Placed This Encounter  Procedures   US  LIMITED JOINT SPACE STRUCTURES LOW BILAT(NO LINKED CHARGES)    Reason for Exam (SYMPTOM  OR DIAGNOSIS REQUIRED):   bilateral foot pain    Preferred imaging location?:   [facility] referral to Physical Therapy    Referral Priority:   Routine    Referral Type:   Physical Medicine    Referral Reason:   Specialty Services Required    Requested Specialty:   Physical Therapy    Number of Visits Requested:   1   Ambulatory referral to Neurology    Referral Priority:   Routine    Referral Type:   Consultation    Referral Reason:   Specialty Services Required    Requested Specialty:   Neurology    Number of Visits Requested:   1   NCV with EMG(electromyography)    Bilat LE    Standing Status:   Future    Expiration Date:   11/13/2024   Meds ordered this encounter  Medications   gabapentin  (NEURONTIN ) 100 MG capsule  Sig: Take 1-3 capsules (100-300 mg total) by mouth 3 (three) times daily as needed.    Dispense:  30 capsule    Refill:  3     Discussed warning signs or symptoms. Please see discharge instructions. Patient expresses understanding.   The above documentation has been reviewed and is accurate and complete Artist Lloyd, M.D.

## 2023-11-14 NOTE — Patient Instructions (Addendum)
 Thank you for coming in today.   I've referred you to Physical Therapy.  Let us  know if you don't hear from them in one week.   I've referred you to Neurology for a Nerve Conduction Study.  Let us  know if you don't hear from them in one week.   I've sent a prescription for Gabapentin  to your pharmacy.

## 2023-11-15 ENCOUNTER — Other Ambulatory Visit: Payer: Self-pay

## 2023-11-15 ENCOUNTER — Encounter: Payer: Self-pay | Admitting: Neurology

## 2023-11-15 DIAGNOSIS — R202 Paresthesia of skin: Secondary | ICD-10-CM

## 2023-11-27 ENCOUNTER — Telehealth: Payer: Self-pay | Admitting: Family Medicine

## 2023-11-27 NOTE — Telephone Encounter (Signed)
 [facility] called and stated that [facility] for [facility]. She has provided the fax number [phone].

## 2023-11-27 NOTE — Telephone Encounter (Signed)
 PT referral re-faxed via EPIC and FAX machine.

## 2023-12-07 DIAGNOSIS — R279 Unspecified lack of coordination: Secondary | ICD-10-CM | POA: Diagnosis not present

## 2023-12-07 DIAGNOSIS — R2689 Other abnormalities of gait and mobility: Secondary | ICD-10-CM | POA: Diagnosis not present

## 2023-12-07 NOTE — Progress Notes (Addendum)
 [address] Rebecca Lutz [id number] 10-30-1950   Chief Complaint: Elevated LFTs  Referring Provider: Wendolyn Jenkins Earnie, MD Primary GI MD: Sampson  HPI: Rebecca Lutz is a 73 y.o. female with past medical history of anxiety/depression, bipolar disorder, CAD, asthma, history of MI, MASH, HTN, high cholesterol, OSA on CPAP, osteoporosis, thyroid  disease, sciatica, peripheral neuropathy, s/p appendectomy who presents today for a complaint of elevated liver enzymes.    Labs 09/05/2023: Platelet count 147, otherwise normal CBC, AST 63, ALT 68, alk phos 56, albumin 4.5, total bilirubin 0.9, normal kidney function, normal TSH   Patient states she has occasional upper abdominal discomfort.  Reports she has had gallstones for years but denies any RUQ abdominal pain, nausea, vomiting, or diarrhea.  She drinks alcohol occasionally but denies any history of heavy drinking.  Denies any family history of liver disease.  Reports her last colonoscopy was done in [facility] December 2020.  She does have history of colon polyps and states that she is due for repeat colonoscopy soon.  She denies any family history of colon cancer.  She has regular bowel movements a few times a week.  Takes Benefiber daily for history of constipation and does well with this.  Denies any straining.  Reports she has a history of hemorrhoids and occasionally will see some blood in her stool.  Denies diarrhea or melena.  Reports that in the last year she has noticed more acid reflux.  Has occasional heartburn as well, particularly if she eats pizza.  This occurs maybe a couple times a week.  Denies any dysphagia.  Previous GI Procedures/Imaging   RUQ US  02/28/2022 1. Cholelithiasis without evidence of acute cholecystitis. 2. Hepatic steatosis.  No focal hepatic lesions. 3. No biliary dilatation.   Past Medical History:  Diagnosis Date   Allergy 1956   allergy shots five yrs   Anxiety    Asthma 1956   Bipolar disorder  (HCC)    CAD (coronary artery disease)    MI 2007   Cataract    Depression 1993   Heart attack (HCC)    Heart murmur    High cholesterol    Hypertension    Idiopathic urticaria    NASH (nonalcoholic steatohepatitis)    OSA on CPAP    Osteoporosis    previosly treated with bonivas   Peripheral neuropathy    Plantar fasciitis    Sciatica    Sleep apnea    Thyroid  disease     Past Surgical History:  Procedure Laterality Date   APPENDECTOMY  1963   DILATION AND CURETTAGE OF UTERUS     idiopathic urticaria     LEG SURGERY Right 2020   poss achilles for plantar fasciitis   NASH     osteoporosis     TONSILLECTOMY      Current Outpatient Medications  Medication Sig Dispense Refill   amLODipine  (NORVASC ) 5 MG tablet Take 1 tablet (5 mg total) by mouth daily. 90 tablet 1   ARIPiprazole  (ABILIFY ) 2 MG tablet Take 1 tablet (2 mg total) by mouth daily. 90 tablet 1   aspirin EC 81 MG tablet Take 81 mg by mouth daily.     ezetimibe  (ZETIA ) 10 MG tablet Take 1 tablet (10 mg total) by mouth daily. 90 tablet 3   gabapentin  (NEURONTIN ) 100 MG capsule Take 1-3 capsules (100-300 mg total) by mouth 3 (three) times daily as needed. 30 capsule 3   levothyroxine  (SYNTHROID ) 100 MCG tablet Take 1 tablet (100 mcg  total) by mouth daily. 90 tablet 1   metoprolol  succinate (TOPROL -XL) 50 MG 24 hr tablet Take 1.5 tablets (75 mg total) by mouth daily. 135 tablet 3   Misc Natural Products (LUTEIN VISION BLEND PO) Take by mouth daily.     polyethylene glycol powder (GLYCOLAX /MIRALAX ) 17 GM/SCOOP powder Take 17 g by mouth in the morning and at bedtime. 3350 g 0   PREVIDENT 5000 BOOSTER PLUS 1.1 % PSTE Place onto teeth.     rosuvastatin  (CRESTOR ) 10 MG tablet Take 1 tablet (10 mg total) by mouth daily. 90 tablet 1   sertraline  (ZOLOFT ) 100 MG tablet Take 1 tablet (100 mg total) by mouth daily. 90 tablet 1   Doxepin  HCl 3 MG TABS Take 1 tablet (3 mg total) by mouth at bedtime. 30 tablet 0   No current  facility-administered medications for this visit.    Allergies as of 12/08/2023 - Review Complete 12/08/2023  Allergen Reaction Noted   Penicillins Rash 07/15/2019    Family History  Problem Relation Age of Onset   Lung cancer Mother    COPD Mother    Heart attack Mother    Heart failure Mother    Cancer Mother    Varicose Veins Mother    Suicidality Father        ? bipolar disorder   Depression Father    Alcohol abuse Father    Cancer Daughter    Liver disease Neg Hx    Colon cancer Neg Hx    Esophageal cancer Neg Hx     Social History   Tobacco Use   Smoking status: Never    Passive exposure: Never   Smokeless tobacco: Never  Vaping Use   Vaping status: Never Used  Substance Use Topics   Alcohol use: Yes    Comment: occasional   Drug use: Never     Review of Systems:    Constitutional: No weight loss, fever, chills, weakness or fatigue Skin: No rash or itching Cardiovascular: No chest pain, chest pressure or palpitations   Respiratory: No SOB or cough Gastrointestinal: See HPI and otherwise negative Neurological: No headache, dizziness or syncope    Physical Exam:  Vital signs: BP 122/62   Pulse 63   Ht 5' 1 (1.549 m)   Wt 157 lb (71.2 kg)   BMI 29.66 kg/m   Wt Readings from Last 3 Encounters:  12/08/23 157 lb (71.2 kg)  11/14/23 157 lb (71.2 kg)  10/30/23 155 lb 6 oz (70.5 kg)     Constitutional: Pleasant female in NAD, alert and cooperative Head:  Normocephalic and atraumatic.  Eyes: No scleral icterus. Conjunctiva pink. Mouth: No oral lesions. Respiratory: Respirations even and unlabored. Lungs clear to auscultation bilaterally.  No wheezes, crackles, or rhonchi.  Cardiovascular:  Regular rate and rhythm. No murmurs. No peripheral edema. Gastrointestinal:  Soft, nondistended, mild tenderness to palpation of RUQ. No rebound or guarding. Normal bowel sounds. No appreciable masses or hepatomegaly. Rectal:  Not performed.  Neurologic:  Alert  and oriented x4;  grossly normal neurologically.  Skin:   Dry and intact without significant lesions or rashes. Psychiatric: Oriented to person, place and time. Demonstrates good judgement and reason without abnormal affect or behaviors.   RELEVANT LABS AND IMAGING: CBC    Component Value Date/Time   WBC 4.2 09/05/2023 0927   RBC 4.37 09/05/2023 0927   HGB 12.9 09/05/2023 0927   HCT 38.8 09/05/2023 0927   PLT 147.0 (L) 09/05/2023 0927   MCV  88.7 09/05/2023 0927   MCHC 33.4 09/05/2023 0927   RDW 13.6 09/05/2023 0927   LYMPHSABS 1.2 09/05/2023 0927   MONOABS 0.3 09/05/2023 0927   EOSABS 0.2 09/05/2023 0927   BASOSABS 0.0 09/05/2023 0927    CMP     Component Value Date/Time   NA 138 09/05/2023 0927   K 4.3 09/05/2023 0927   CL 103 09/05/2023 0927   CO2 29 09/05/2023 0927   GLUCOSE 97 09/05/2023 0927   BUN 13 09/05/2023 0927   CREATININE 0.75 09/05/2023 0927   CALCIUM  9.9 09/05/2023 0927   PROT 6.9 09/05/2023 0927   ALBUMIN 4.5 09/05/2023 0927   AST 63 (H) 09/05/2023 0927   ALT 68 (H) 09/05/2023 0927   ALKPHOS 56 09/05/2023 0927   BILITOT 0.9 09/05/2023 0927   Fibrosis 4 Score = 3.9 (Fib-4 interpretation is not validated for people under 35 or over 12 years of age. However, scores under 2.0 are generally considered low risk.)   Assessment/Plan:   Hepatic steatosis Elevated liver enzymes Cholelithiasis Patient seen today for evaluation of elevated liver enzymes.  Has had mild elevations in AST and ALT over the last couple of years.  Noted to have hepatic steatosis and cholelithiasis on RUQ ultrasound in 2023.  Has some tenderness to palpation of RUQ on exam today. She has several risk factors for metabolic associated liver disease, but will pursue further serologic workup to rule out other causes.   - Order RUQ US  - Labs today: CBC, CMP, TSH, PT/INR, iron/ferritin, HepCAb, HepBsAg, HepBsAb, HepBcAb, HAV, TTG, IgA, IgG, ANA, ASMA, AMA, alpha-one-antitrypsin - Abstain  from all alcohol including beer, wine, liquor, and non-alcoholic beer.  - Work to maintain a healthy weight through portion control and exercise  - Maximize control of any hypergycemia and hyperlipidemia   GERD History of colon polyps Patient with history of colon polyps, reports last colonoscopy done in [place] around 2020.  States she is due for repeat soon.  Will request past records. In the last year she has noticed acid reflux and heartburn, which occurs a couple times a week.  She does have identifiable food triggers including pizza.  Not on any antireflux medications at this time.  - Start Pantoprazole  40 mg daily - Request past GI records. - Follow up in 6-8 weeks - Consider EGD at time of next colonoscopy, or earlier if symptoms persist despite PPI   Camie Furbish, PA-C [facility] 12/08/2023, 10:31 AM  Patient Care Team: Wendolyn Jenkins Jansky, MD as PCP - General (Family Medicine)     Addendum 01/04/2024:  Past GI records received:  Colonoscopy 05/07/2018 [facility]): - Nonbleeding internal hemorrhoids - No specimens collected - Recall 5 years  Colonoscopy 03/25/2013:  - Nonbleeding internal hemorrhoids - Recall 5 years

## 2023-12-08 ENCOUNTER — Ambulatory Visit (INDEPENDENT_AMBULATORY_CARE_PROVIDER_SITE_OTHER): Admitting: Gastroenterology

## 2023-12-08 ENCOUNTER — Other Ambulatory Visit

## 2023-12-08 ENCOUNTER — Encounter: Payer: Self-pay | Admitting: Gastroenterology

## 2023-12-08 VITALS — BP 122/62 | HR 63 | Ht 61.0 in | Wt 157.0 lb

## 2023-12-08 DIAGNOSIS — K8020 Calculus of gallbladder without cholecystitis without obstruction: Secondary | ICD-10-CM

## 2023-12-08 DIAGNOSIS — K219 Gastro-esophageal reflux disease without esophagitis: Secondary | ICD-10-CM

## 2023-12-08 DIAGNOSIS — Z860100 Personal history of colon polyps, unspecified: Secondary | ICD-10-CM

## 2023-12-08 DIAGNOSIS — R748 Abnormal levels of other serum enzymes: Secondary | ICD-10-CM

## 2023-12-08 DIAGNOSIS — K760 Fatty (change of) liver, not elsewhere classified: Secondary | ICD-10-CM

## 2023-12-08 LAB — COMPREHENSIVE METABOLIC PANEL WITH GFR
ALT: 68 U/L — ABNORMAL HIGH (ref 0–35)
AST: 65 U/L — ABNORMAL HIGH (ref 0–37)
Albumin: 4.7 g/dL (ref 3.5–5.2)
Alkaline Phosphatase: 87 U/L (ref 39–117)
BUN: 11 mg/dL (ref 6–23)
CO2: 26 meq/L (ref 19–32)
Calcium: 10 mg/dL (ref 8.4–10.5)
Chloride: 105 meq/L (ref 96–112)
Creatinine, Ser: 0.64 mg/dL (ref 0.40–1.20)
GFR: 86.7 mL/min (ref >60.00)
Glucose, Bld: 98 mg/dL (ref 70–99)
Potassium: 4.2 meq/L (ref 3.5–5.1)
Sodium: 139 meq/L (ref 135–145)
Total Bilirubin: 0.7 mg/dL (ref 0.2–1.2)
Total Protein: 7.1 g/dL (ref 6.0–8.3)

## 2023-12-08 LAB — IBC + FERRITIN
Ferritin: 98.1 ng/mL (ref 10.0–291.0)
Iron: 87 ug/dL (ref 42–145)
Saturation Ratios: 23.6 % (ref 20.0–50.0)
TIBC: 368.2 ug/dL (ref 250.0–450.0)
Transferrin: 263 mg/dL (ref 212.0–360.0)

## 2023-12-08 LAB — CBC WITH DIFFERENTIAL/PLATELET
Basophils Absolute: 0 K/uL (ref 0.0–0.1)
Basophils Relative: 0.9 % (ref 0.0–3.0)
Eosinophils Absolute: 0.2 K/uL (ref 0.0–0.7)
Eosinophils Relative: 4.6 % (ref 0.0–5.0)
HCT: 37.1 % (ref 36.0–46.0)
Hemoglobin: 12.9 g/dL (ref 12.0–15.0)
Lymphocytes Relative: 27 % (ref 12.0–46.0)
Lymphs Abs: 1.3 K/uL (ref 0.7–4.0)
MCHC: 34.8 g/dL (ref 30.0–36.0)
MCV: 85.6 fl (ref 78.0–100.0)
Monocytes Absolute: 0.3 K/uL (ref 0.1–1.0)
Monocytes Relative: 7.3 % (ref 3.0–12.0)
Neutro Abs: 2.8 K/uL (ref 1.4–7.7)
Neutrophils Relative %: 60.2 % (ref 43.0–77.0)
Platelets: 136 K/uL — ABNORMAL LOW (ref 150.0–400.0)
RBC: 4.34 Mil/uL (ref 3.87–5.11)
RDW: 13.2 % (ref 11.5–15.5)
WBC: 4.7 K/uL (ref 4.0–10.5)

## 2023-12-08 LAB — PROTIME-INR
INR: 1.2 ratio — ABNORMAL HIGH (ref 0.8–1.0)
Prothrombin Time: 12.2 s (ref 9.6–13.1)

## 2023-12-08 LAB — TSH: TSH: 1.13 u[IU]/mL (ref 0.35–5.50)

## 2023-12-08 MED ORDER — PANTOPRAZOLE SODIUM 40 MG PO TBEC: 40.0000 mg | DELAYED_RELEASE_TABLET | Freq: Every day | ORAL | 3 refills | Status: AC

## 2023-12-08 NOTE — Patient Instructions (Signed)
 You will be contacted by [facility] Scheduling in the next 2 days to arrange a Ultrasound.  The number on your caller ID will be [phone], please answer when they call.  If you have not heard from them in 2 days please call [phone] to schedule.    We will request previous GI records from [facility], If you come across your procedure reports you can bring copies with you at your next follow up.   START Pantoprazole 40 mg, Prescription has been sent to your pharmacy  Your provider has requested that you go to the basement level for lab work before leaving today. Press B on the elevator. The lab is located at the first door on the left as you exit the elevator.   Due to recent changes in healthcare laws, you may see the results of your imaging and laboratory studies on MyChart before your provider has had a chance to review them.  We understand that in some cases there may be results that are confusing or concerning to you. Not all laboratory results come back in the same time frame and the provider may be waiting for multiple results in order to interpret others.  Please give us  48 hours in order for your provider to thoroughly review all the results before contacting the office for clarification of your results.    I appreciate the  opportunity to care for you  Thank You   Camie Heinz,PA-C

## 2023-12-09 ENCOUNTER — Encounter: Payer: Self-pay | Admitting: Gastroenterology

## 2023-12-10 ENCOUNTER — Ambulatory Visit (HOSPITAL_COMMUNITY)
Admission: RE | Admit: 2023-12-10 | Discharge: 2023-12-10 | Disposition: A | Discharge status: Home / Self Care | Source: Ambulatory Visit | Attending: Gastroenterology

## 2023-12-10 DIAGNOSIS — K8020 Calculus of gallbladder without cholecystitis without obstruction: Secondary | ICD-10-CM | POA: Insufficient documentation

## 2023-12-10 DIAGNOSIS — K760 Fatty (change of) liver, not elsewhere classified: Secondary | ICD-10-CM | POA: Insufficient documentation

## 2023-12-10 DIAGNOSIS — R748 Abnormal levels of other serum enzymes: Secondary | ICD-10-CM | POA: Insufficient documentation

## 2023-12-11 ENCOUNTER — Ambulatory Visit: Payer: Self-pay | Admitting: Gastroenterology

## 2023-12-11 DIAGNOSIS — R2689 Other abnormalities of gait and mobility: Secondary | ICD-10-CM | POA: Diagnosis not present

## 2023-12-11 DIAGNOSIS — R279 Unspecified lack of coordination: Secondary | ICD-10-CM | POA: Diagnosis not present

## 2023-12-13 LAB — MITOCHONDRIAL ANTIBODIES: Mitochondrial M2 Ab, IgG: <=20 U (ref <=20.0)

## 2023-12-13 LAB — TISSUE TRANSGLUTAMINASE, IGA: (tTG) Ab, IgA: <1 U/mL

## 2023-12-13 LAB — HEPATITIS B CORE ANTIBODY, TOTAL: Hep B Core Total Ab: NONREACTIVE

## 2023-12-13 LAB — HEPATITIS C ANTIBODY: Hepatitis C Ab: NONREACTIVE

## 2023-12-13 LAB — HEPATITIS B SURFACE ANTIBODY,QUALITATIVE: Hep B S Ab: NONREACTIVE

## 2023-12-13 LAB — ALPHA-1-ANTITRYPSIN: A-1 Antitrypsin, Ser: 152 mg/dL (ref 83–199)

## 2023-12-13 LAB — IGG: IgG (Immunoglobin G), Serum: 573 mg/dL — ABNORMAL LOW (ref 600–1540)

## 2023-12-13 LAB — HEPATITIS B SURFACE ANTIGEN: Hepatitis B Surface Ag: NONREACTIVE

## 2023-12-13 LAB — ANA: Anti Nuclear Antibody (ANA): NEGATIVE

## 2023-12-13 LAB — IGA: Immunoglobulin A: 160 mg/dL (ref 70–320)

## 2023-12-13 LAB — HEPATITIS A ANTIBODY, TOTAL: Hepatitis A AB,Total: REACTIVE — AB

## 2023-12-13 LAB — ANTI-SMOOTH MUSCLE ANTIBODY, IGG: Actin (Smooth Muscle) Antibody (IGG): <20 U (ref <20)

## 2023-12-14 DIAGNOSIS — R279 Unspecified lack of coordination: Secondary | ICD-10-CM | POA: Diagnosis not present

## 2023-12-14 DIAGNOSIS — R2689 Other abnormalities of gait and mobility: Secondary | ICD-10-CM | POA: Diagnosis not present

## 2023-12-19 DIAGNOSIS — R279 Unspecified lack of coordination: Secondary | ICD-10-CM | POA: Diagnosis not present

## 2023-12-19 DIAGNOSIS — R2689 Other abnormalities of gait and mobility: Secondary | ICD-10-CM | POA: Diagnosis not present

## 2023-12-21 DIAGNOSIS — R279 Unspecified lack of coordination: Secondary | ICD-10-CM | POA: Diagnosis not present

## 2023-12-21 DIAGNOSIS — R2689 Other abnormalities of gait and mobility: Secondary | ICD-10-CM | POA: Diagnosis not present

## 2023-12-22 ENCOUNTER — Ambulatory Visit (INDEPENDENT_AMBULATORY_CARE_PROVIDER_SITE_OTHER): Admitting: Neurology

## 2023-12-22 DIAGNOSIS — G629 Polyneuropathy, unspecified: Secondary | ICD-10-CM

## 2023-12-22 DIAGNOSIS — R202 Paresthesia of skin: Secondary | ICD-10-CM

## 2023-12-22 NOTE — Procedures (Signed)
 [facility]  [address] [place], [address]  [place], [place] [address] Tel: [phone] Fax: [phone] Test Date:  12/22/2023  Patient: Rebecca Lutz DOB: 03/30/1951 Physician: Tonita Blanch, DO  Sex: Female Height: 5' 1 Ref Phys: Artist CANDIE Lloyd, MD  ID#: [id number]   Technician:    History: This is a 73 year old female referred for evaluation of bilateral feet numbness and tingling, weakness, and imbalance.  NCV & EMG Findings: Extensive electrodiagnostic testing of the right lower extremity and additional studies of the left shows:  Bilateral sural and superficial peroneal sensory responses are absent. Bilateral peroneal (EDB) and tibial motor responses are absent.  Bilateral peroneal motor responses at the tibialis anterior showed reduced amplitude (R1.6, L1.8 mV). Bilateral tibial H reflex studies are absent. Diffuse chronic motor axon loss changes are seen affecting all the tested muscles of the lower extremity, which is more pronounced in the muscles below the knee where there is also evidence of accompanying active denervation.  Fibrillation potentials are not seen in the lumbar paraspinal muscles.  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   ___________________________ Tonita Blanch, DO    Nerve Conduction Studies   Stim Site NR Peak (ms) Norm Peak (ms) O-P Amp (V) Norm O-P Amp  Left Sup Peroneal Anti Sensory (Ant Lat Mall)  32 C  12 cm *NR  <4.6  >3  Right Sup Peroneal Anti Sensory (Ant Lat Mall)  32 C  12 cm *NR  <4.6  >3  Left Sural Anti Sensory (Lat Mall)  32 C  Calf *NR  <4.6  >3  Right Sural Anti Sensory (Lat Mall)  32 C  Calf *NR  <4.6  >3     Stim Site NR Onset (ms) Norm Onset (ms) O-P Amp (mV) Norm O-P Amp Site1 Site2 Delta-0 (ms) Dist (cm) Vel (m/s) Norm Vel (m/s)  Left Peroneal Motor (Ext Dig Brev)  32 C   Ankle *NR  <6.0  >2.5 B Fib Ankle  0.0  >40  B Fib *NR     Poplt B Fib  0.0  >40  Poplt *NR            Right Peroneal Motor (Ext Dig Brev)  32 C  Ankle *NR  <6.0  >2.5 B Fib Ankle  0.0  >40  B Fib *NR     Poplt B Fib  0.0  >40  Poplt *NR            Left Peroneal TA Motor (Tib Ant)  32 C  Fib Head    4.2 <4.5 *1.6 >3 Poplit Fib Head 1.3 8.0 62 >40  Poplit    5.5 <5.7 1.3         Right Peroneal TA Motor (Tib Ant)  32 C  Fib Head    2.7 <4.5 *1.8 >3 Poplit Fib Head 1.2 8.0 67 >40  Poplit    3.9 <5.7 1.6         Left Tibial Motor (Abd Hall Brev)  32 C  Ankle *NR  <6.0  >4 Knee Ankle  0.0  >40  Knee *NR            Right Tibial Motor (Abd Hall Brev)  32 C  Ankle *NR  <6.0  >4 Knee Ankle  0.0  >40  Knee *NR             Electromyography   Side  Muscle Ins.Act Fibs Fasc Recrt Amp Dur Poly Activation Comment  Right AntTibialis Nml *1+ Nml *2- *1+ *1+ *1+ Nml N/A  Right Gastroc Nml *1+ Nml *2- *1+ *1+ *1+ Nml N/A  Right Flex Dig Long Nml *1+ Nml *2- *2- *1+ *1+ Nml N/A  Right RectFemoris Nml Nml Nml *1- *1+ *1+ *1+ Nml N/A  Right BicepsFemS Nml Nml Nml *2- *1+ *1+ *1+ Nml N/A  Right GluteusMed Nml Nml Nml *1- *1+ *1+ *1+ Nml N/A  Right AdductorLong Nml Nml Nml *2- *1+ *1+ *1+ Nml N/A  Right LumbarPSP Nml Nml Nml - - - - Nml N/A  Left AntTibialis Nml *1+ Nml *3- *1+ *1+ *1+ Nml N/A  Left Gastroc Nml *1+ Nml *2- *1+ *1+ *1+ Nml N/A  Left RectFemoris Nml Nml Nml *2- *1+ *1+ *1+ Nml N/A  Left GluteusMed Nml Nml Nml *1- *2- *1+ *1+ Nml N/A  Left Flex Dig Long Nml *1+ Nml *3- *1+ *1+ *1+ Nml N/A  Left BicepsFemS Nml Nml Nml *2- *1+ *1+ *1+ Nml N/A      Waveforms:

## 2023-12-26 ENCOUNTER — Other Ambulatory Visit: Payer: Self-pay

## 2023-12-26 DIAGNOSIS — R279 Unspecified lack of coordination: Secondary | ICD-10-CM | POA: Diagnosis not present

## 2023-12-26 DIAGNOSIS — R2689 Other abnormalities of gait and mobility: Secondary | ICD-10-CM | POA: Diagnosis not present

## 2023-12-26 NOTE — Telephone Encounter (Signed)
 Inbound call from patient requesting f/u call to discuss receiving a hepatitis B shot. Please advise.   Thank you

## 2023-12-28 ENCOUNTER — Ambulatory Visit: Payer: Self-pay | Admitting: Family Medicine

## 2023-12-28 DIAGNOSIS — R279 Unspecified lack of coordination: Secondary | ICD-10-CM | POA: Diagnosis not present

## 2023-12-28 DIAGNOSIS — R2689 Other abnormalities of gait and mobility: Secondary | ICD-10-CM | POA: Diagnosis not present

## 2023-12-28 NOTE — Progress Notes (Signed)
 Nerve conduction study shows severe peripheral neuropathy.  This is going to be challenging to treat.  How are you feeling with the gabapentin ?

## 2023-12-28 NOTE — Telephone Encounter (Signed)
 Forwarding to Dr. Denyse Amass to review and advise.

## 2024-01-02 DIAGNOSIS — R2689 Other abnormalities of gait and mobility: Secondary | ICD-10-CM | POA: Diagnosis not present

## 2024-01-02 DIAGNOSIS — R279 Unspecified lack of coordination: Secondary | ICD-10-CM | POA: Diagnosis not present

## 2024-01-04 ENCOUNTER — Telehealth: Payer: Self-pay | Admitting: Gastroenterology

## 2024-01-04 DIAGNOSIS — R2689 Other abnormalities of gait and mobility: Secondary | ICD-10-CM | POA: Diagnosis not present

## 2024-01-04 DIAGNOSIS — R279 Unspecified lack of coordination: Secondary | ICD-10-CM | POA: Diagnosis not present

## 2024-01-04 NOTE — Progress Notes (Unsigned)
   Rebecca Ileana Collet, PhD, LAT, ATC acting as a scribe for Artist Lloyd, MD.  Rebecca Lutz is a 73 y.o. female who presents to [facility] at [facility] today for f/u bilat foot pain w/ NCV study review. Pt was last seen by Dr. Lloyd on 11/14/23 and was referred to PT at [facility] and a NCV study was ordered.  Today, pt reports she's been working w/ PT at [facility] and had a re-evaluation yesterday. Bilat foot pain is about the same.   Dx testing: 12/22/23 NCV study  Pertinent review of systems: No fevers or chills  Relevant historical information: Coronary artery disease. Peripheral neuropathy  Exam:  BP 120/80   Pulse 62   Ht 5' 1 (1.549 m)   Wt 158 lb (71.7 kg)   SpO2 96%   BMI 29.85 kg/m  General: Well Developed, well nourished, and in no acute distress.   MSK: Feet bilaterally normal motion.  Normal gait    Lab and Radiology Results   Nerve conduction study: Date of service December 22, 2023  History: This is a 73 year old female referred for evaluation of bilateral feet numbness and tingling, weakness, and imbalance.   NCV & EMG Findings: Extensive electrodiagnostic testing of the right lower extremity and additional studies of the left shows:  Bilateral sural and superficial peroneal sensory responses are absent. Bilateral peroneal (EDB) and tibial motor responses are absent.  Bilateral peroneal motor responses at the tibialis anterior showed reduced amplitude (R1.6, L1.8 mV). Bilateral tibial H reflex studies are absent. Diffuse chronic motor axon loss changes are seen affecting all the tested muscles of the lower extremity, which is more pronounced in the muscles below the knee where there is also evidence of accompanying active denervation.  Fibrillation potentials are not seen in the lumbar paraspinal muscles.   Impression: The electrophysiologic findings are most consistent with an active on chronic sensorimotor axonal polyneuropathy  affecting bilateral lower extremities, severe.     ___________________________ Tonita Blanch, DO   Assessment and Plan: 73 y.o. female with significant peripheral neuropathy involving both lower extremities rated as severe based on nerve conduction study.  Low B12 and elevated TSH were both corrected previously.  We optimize gabapentin  recommend higher doses at bedtime and not taking it during the day.  We can continue to adjust medication as for bothersome painful neuropathy/paresthesias.  Continue balance physical therapy and home exercise program check back in 3 months or sooner if needed.   PDMP not reviewed this encounter. No orders of the defined types were placed in this encounter.  Meds ordered this encounter  Medications   gabapentin  (NEURONTIN ) 300 MG capsule    Sig: Take 1 capsule (300 mg total) by mouth at bedtime.    Dispense:  90 capsule    Refill:  2     Discussed warning signs or symptoms. Please see discharge instructions. Patient expresses understanding.   The above documentation has been reviewed and is accurate and complete Artist Lloyd, M.D.  Total encounter time 30 minutes including face-to-face time with the patient and, reviewing past medical record, and charting on the date of service.

## 2024-01-04 NOTE — Telephone Encounter (Signed)
 I have reviewed patient's past colonoscopy reports.  Last colonoscopy done 05/07/2018 with 5-year recall recommended due to history of polyps, though no polyps were seen during that procedure or the previous procedure done in 2014. She is due for repeat colonoscopy, and we can schedule this if she would like to go ahead and do that, otherwise can arrange at follow-up in October.  Please reach out to patient and see how she is doing regarding her GERD symptoms.  If she continues to have symptoms despite taking pantoprazole  40 mg daily, and wants to schedule colonoscopy, would recommend also scheduling EGD to be done at the same time.

## 2024-01-05 ENCOUNTER — Ambulatory Visit (INDEPENDENT_AMBULATORY_CARE_PROVIDER_SITE_OTHER): Admitting: Family Medicine

## 2024-01-05 VITALS — BP 120/80 | HR 62 | Ht 61.0 in | Wt 158.0 lb

## 2024-01-05 DIAGNOSIS — G609 Hereditary and idiopathic neuropathy, unspecified: Secondary | ICD-10-CM | POA: Diagnosis not present

## 2024-01-05 MED ORDER — GABAPENTIN 300 MG PO CAPS: 300.0000 mg | ORAL_CAPSULE | Freq: Every day | ORAL | 2 refills | Status: AC

## 2024-01-05 NOTE — Patient Instructions (Signed)
 Thank you for coming in today.   Increase gabapentin  to 300mg  at bedtime only.   Continue PT and exercises.   Recheck in 3 months.

## 2024-01-05 NOTE — Telephone Encounter (Signed)
 Spoke with pt. She reports that her GERD symptoms have improved and she is not having to take the Pantoprazole  on a regular basis. She reports she has only needed it three times since her ov. Discussed timing for repeat colonoscopy. Pt states she would like to wait until her f/u appt in October to get scheduled for the procedure.

## 2024-01-09 DIAGNOSIS — R279 Unspecified lack of coordination: Secondary | ICD-10-CM | POA: Diagnosis not present

## 2024-01-09 DIAGNOSIS — R2689 Other abnormalities of gait and mobility: Secondary | ICD-10-CM | POA: Diagnosis not present

## 2024-01-11 DIAGNOSIS — R2689 Other abnormalities of gait and mobility: Secondary | ICD-10-CM | POA: Diagnosis not present

## 2024-01-11 DIAGNOSIS — R279 Unspecified lack of coordination: Secondary | ICD-10-CM | POA: Diagnosis not present

## 2024-01-16 DIAGNOSIS — R2689 Other abnormalities of gait and mobility: Secondary | ICD-10-CM | POA: Diagnosis not present

## 2024-01-16 DIAGNOSIS — R279 Unspecified lack of coordination: Secondary | ICD-10-CM | POA: Diagnosis not present

## 2024-01-18 DIAGNOSIS — R279 Unspecified lack of coordination: Secondary | ICD-10-CM | POA: Diagnosis not present

## 2024-01-18 DIAGNOSIS — R2689 Other abnormalities of gait and mobility: Secondary | ICD-10-CM | POA: Diagnosis not present

## 2024-01-23 DIAGNOSIS — R2689 Other abnormalities of gait and mobility: Secondary | ICD-10-CM | POA: Diagnosis not present

## 2024-01-23 DIAGNOSIS — R279 Unspecified lack of coordination: Secondary | ICD-10-CM | POA: Diagnosis not present

## 2024-01-25 DIAGNOSIS — R2689 Other abnormalities of gait and mobility: Secondary | ICD-10-CM | POA: Diagnosis not present

## 2024-01-25 DIAGNOSIS — R279 Unspecified lack of coordination: Secondary | ICD-10-CM | POA: Diagnosis not present

## 2024-01-30 DIAGNOSIS — R2689 Other abnormalities of gait and mobility: Secondary | ICD-10-CM | POA: Diagnosis not present

## 2024-01-30 DIAGNOSIS — R279 Unspecified lack of coordination: Secondary | ICD-10-CM | POA: Diagnosis not present

## 2024-02-01 DIAGNOSIS — R279 Unspecified lack of coordination: Secondary | ICD-10-CM | POA: Diagnosis not present

## 2024-02-01 DIAGNOSIS — R2689 Other abnormalities of gait and mobility: Secondary | ICD-10-CM | POA: Diagnosis not present

## 2024-02-06 DIAGNOSIS — R279 Unspecified lack of coordination: Secondary | ICD-10-CM | POA: Diagnosis not present

## 2024-02-06 DIAGNOSIS — R2689 Other abnormalities of gait and mobility: Secondary | ICD-10-CM | POA: Diagnosis not present

## 2024-02-08 ENCOUNTER — Encounter: Payer: Self-pay | Admitting: Gastroenterology

## 2024-02-08 ENCOUNTER — Ambulatory Visit: Admitting: Gastroenterology

## 2024-02-08 VITALS — BP 118/66 | HR 65 | Ht 61.0 in | Wt 152.0 lb

## 2024-02-08 DIAGNOSIS — M79671 Pain in right foot: Secondary | ICD-10-CM | POA: Diagnosis not present

## 2024-02-08 DIAGNOSIS — M79672 Pain in left foot: Secondary | ICD-10-CM | POA: Diagnosis not present

## 2024-02-08 DIAGNOSIS — K219 Gastro-esophageal reflux disease without esophagitis: Secondary | ICD-10-CM

## 2024-02-08 DIAGNOSIS — Z860100 Personal history of colon polyps, unspecified: Secondary | ICD-10-CM

## 2024-02-08 DIAGNOSIS — R748 Abnormal levels of other serum enzymes: Secondary | ICD-10-CM | POA: Diagnosis not present

## 2024-02-08 DIAGNOSIS — Z23 Encounter for immunization: Secondary | ICD-10-CM | POA: Diagnosis not present

## 2024-02-08 DIAGNOSIS — R279 Unspecified lack of coordination: Secondary | ICD-10-CM | POA: Diagnosis not present

## 2024-02-08 DIAGNOSIS — K8020 Calculus of gallbladder without cholecystitis without obstruction: Secondary | ICD-10-CM

## 2024-02-08 DIAGNOSIS — K760 Fatty (change of) liver, not elsewhere classified: Secondary | ICD-10-CM | POA: Diagnosis not present

## 2024-02-08 DIAGNOSIS — R2689 Other abnormalities of gait and mobility: Secondary | ICD-10-CM | POA: Diagnosis not present

## 2024-02-08 MED ORDER — NA SULFATE-K SULFATE-MG SULF 17.5-3.13-1.6 GM/177ML PO SOLN: 1.0000 | Freq: Once | ORAL | 0 refills | Status: AC

## 2024-02-08 NOTE — Progress Notes (Signed)
 [address] Rebecca Lutz [id number] Aug 05, 1950   Chief Complaint: Elevated liver enzymes  Referring Provider: Wendolyn Jenkins Earnie, MD Primary GI MD: Dr. Avram  HPI: Rebecca Lutz is a 73 y.o. female with past medical history of anxiety/depression, bipolar disorder, CAD, asthma, history of MI, MASH, HTN, high cholesterol, OSA on CPAP, osteoporosis, thyroid  disease, sciatica, peripheral neuropathy, s/p appendectomy who presents today for follow up.    Labs 09/05/2023: Platelet count 147, otherwise normal CBC, AST 63, ALT 68, alk phos 56, albumin 4.5, total bilirubin 0.9, normal kidney function, normal TSH   At last visit 12/08/2023 patient seen for evaluation of elevated liver enzymes.  Noted to have mild elevations in AST and ALT over the last couple years, with hepatic steatosis and cholelithiasis on RUQ ultrasound in 2023.  Had some tenderness to palpation of RUQ. Also endorsed acid reflux and heartburn a couple times a week over the last year with identifiable food triggers.  Was started on pantoprazole  40 mg daily with consideration for EGD should symptoms persist.  RUQ US  showed gallstones without evidence of infection or inflammation and hepatic steatosis.  Labs showed INR 1.2 but normal PTT, mild elevation in liver enzymes, mild thrombocytopenia.  Decreased IgG.  Otherwise workup unremarkable.   Patient states she has regular bowel movements.  Every now and then sees a little BRBPR, which she attributes to hemorrhoids.  Denies any straining or hard stools.  Uses Tucks pads as needed for hemorrhoids which helps.  Denies any abdominal pain.  At last visit was started on pantoprazole  40 mg daily.  States she is not really having very much reflux, just every now and then usually if she eats something spicy so he is only taking PPI as needed and it does relieve her symptoms.  Denies any dysphagia.  Having reflux less than weekly.  Denies any family history of esophageal, stomach, or colon  cancer.  Denies any family history of liver disease.  Previous GI Procedures/Imaging    RUQ US  12/10/2023 1. Cholelithiasis without acute cholecystitis. 2. Increased hepatic echogenicity, a nonspecific finding that is most commonly seen on the basis of steatosis in the absence of known liver disease.  RUQ US  02/28/2022 1. Cholelithiasis without evidence of acute cholecystitis. 2. Hepatic steatosis.  No focal hepatic lesions. 3. No biliary dilatation.  Colonoscopy 05/07/2018 [facility]): - Nonbleeding internal hemorrhoids - No specimens collected - Recall 5 years   Colonoscopy 03/25/2013:  - Nonbleeding internal hemorrhoids - Recall 5 years  Past Medical History:  Diagnosis Date   Allergy 1956   allergy shots five yrs   Anxiety    Asthma 1956   Bipolar disorder (HCC)    CAD (coronary artery disease)    MI 2007   Cataract    Depression 1993   Heart attack (HCC)    Heart murmur    High cholesterol    Hypertension    Idiopathic urticaria    NASH (nonalcoholic steatohepatitis)    OSA on CPAP    Osteoporosis    previosly treated with bonivas   Peripheral neuropathy    Plantar fasciitis    Sciatica    Sleep apnea    Thyroid  disease     Past Surgical History:  Procedure Laterality Date   APPENDECTOMY  1963   DILATION AND CURETTAGE OF UTERUS     idiopathic urticaria     LEG SURGERY Right 2020   poss achilles for plantar fasciitis   NASH     osteoporosis  TONSILLECTOMY      Current Outpatient Medications  Medication Sig Dispense Refill   amLODipine  (NORVASC ) 5 MG tablet Take 1 tablet (5 mg total) by mouth daily. 90 tablet 1   ARIPiprazole  (ABILIFY ) 2 MG tablet Take 1 tablet (2 mg total) by mouth daily. 90 tablet 1   aspirin EC 81 MG tablet Take 81 mg by mouth daily.     ezetimibe  (ZETIA ) 10 MG tablet Take 1 tablet (10 mg total) by mouth daily. 90 tablet 3   gabapentin  (NEURONTIN ) 300 MG capsule Take 1 capsule (300 mg total) by mouth at  bedtime. 90 capsule 2   levothyroxine  (SYNTHROID ) 100 MCG tablet Take 1 tablet (100 mcg total) by mouth daily. 90 tablet 1   metoprolol  succinate (TOPROL -XL) 50 MG 24 hr tablet Take 1.5 tablets (75 mg total) by mouth daily. 135 tablet 3   Misc Natural Products (LUTEIN VISION BLEND PO) Take by mouth daily.     pantoprazole  (PROTONIX ) 40 MG tablet Take 1 tablet (40 mg total) by mouth daily. 30 tablet 3   polyethylene glycol powder (GLYCOLAX /MIRALAX ) 17 GM/SCOOP powder Take 17 g by mouth in the morning and at bedtime. 3350 g 0   PREVIDENT 5000 BOOSTER PLUS 1.1 % PSTE Place onto teeth.     rosuvastatin  (CRESTOR ) 10 MG tablet Take 1 tablet (10 mg total) by mouth daily. 90 tablet 1   sertraline  (ZOLOFT ) 100 MG tablet Take 1 tablet (100 mg total) by mouth daily. 90 tablet 1   No current facility-administered medications for this visit.    Allergies as of 02/08/2024 - Review Complete 02/08/2024  Allergen Reaction Noted   Penicillins Rash 07/15/2019    Family History  Problem Relation Age of Onset   Lung cancer Mother    COPD Mother    Heart attack Mother    Heart failure Mother    Cancer Mother    Varicose Veins Mother    Suicidality Father        ? bipolar disorder   Depression Father    Alcohol abuse Father    Cancer Daughter    Liver disease Neg Hx    Colon cancer Neg Hx    Esophageal cancer Neg Hx     Social History   Tobacco Use   Smoking status: Never    Passive exposure: Never   Smokeless tobacco: Never  Vaping Use   Vaping status: Never Used  Substance Use Topics   Alcohol use: Yes    Comment: occasional   Drug use: Never     Review of Systems:    Constitutional: No fever, chills Cardiovascular: No chest pain Respiratory: No SOB  Gastrointestinal: See HPI and otherwise negative   Physical Exam:  Vital signs: BP 118/66 (BP Location: Left Arm, Patient Position: Sitting, Cuff Size: Normal)   Pulse 65   Ht 5' 1 (1.549 m)   Wt 152 lb (68.9 kg)   BMI 28.72  kg/m   Constitutional: Pleasant, well-appearing female in NAD, alert and cooperative Head:  Normocephalic and atraumatic.  Eyes: No scleral icterus.  Respiratory: Respirations even and unlabored. Lungs clear to auscultation bilaterally.  No wheezes, crackles, or rhonchi.  Cardiovascular:  Regular rate and rhythm. No murmurs. No peripheral edema. Gastrointestinal:  Soft, nondistended, nontender. No rebound or guarding. Normal bowel sounds. No appreciable masses or hepatomegaly. Rectal:  Deferred to colonoscopy Neurologic:  Alert and oriented x4;  grossly normal neurologically.  Skin:   Dry and intact without significant lesions or rashes.  Psychiatric: Oriented to person, place and time. Demonstrates good judgement and reason without abnormal affect or behaviors.   RELEVANT LABS AND IMAGING: CBC    Component Value Date/Time   WBC 4.7 12/08/2023 1112   RBC 4.34 12/08/2023 1112   HGB 12.9 12/08/2023 1112   HCT 37.1 12/08/2023 1112   PLT 136.0 (L) 12/08/2023 1112   MCV 85.6 12/08/2023 1112   MCHC 34.8 12/08/2023 1112   RDW 13.2 12/08/2023 1112   LYMPHSABS 1.3 12/08/2023 1112   MONOABS 0.3 12/08/2023 1112   EOSABS 0.2 12/08/2023 1112   BASOSABS 0.0 12/08/2023 1112    CMP     Component Value Date/Time   NA 139 12/08/2023 1112   K 4.2 12/08/2023 1112   CL 105 12/08/2023 1112   CO2 26 12/08/2023 1112   GLUCOSE 98 12/08/2023 1112   BUN 11 12/08/2023 1112   CREATININE 0.64 12/08/2023 1112   CALCIUM  10.0 12/08/2023 1112   PROT 7.1 12/08/2023 1112   ALBUMIN 4.7 12/08/2023 1112   AST 65 (H) 12/08/2023 1112   ALT 68 (H) 12/08/2023 1112   ALKPHOS 87 12/08/2023 1112   BILITOT 0.7 12/08/2023 1112   Fibrosis 4 Score = 4.35  Fib-4 interpretation is not validated for people under 35 or over 68 years of age. However, scores under 2.0 are generally considered low risk.    Assessment/Plan:   Hepatic steatosis Elevated liver enzymes Gallstones Patient seen today for follow-up.   Seen 12/08/2023 for evaluation of elevated liver enzymes.  Noted to have mild elevations in AST and ALT over the last couple years with hepatic steatosis and cholelithiasis on RUQ ultrasound in 2023.  Repeat RUQ US  again shows cholelithiasis without acute cholecystitis, and hepatic steatosis. Workup showed INR 1.2, mild elevation in liver enzymes, mild thrombocytopenia, decreased IgG, otherwise unremarkable. Found to be nonimmune to hepatitis B and will be getting vaccination today.  - Plan for repeat labs in 3 weeks: CBC, CMP, IgG - Order liver elastography to evaluate for advanced fibrosis - Advised complete avoidance of alcohol, healthy diet with reduced carbohydrates, weight loss, exercise  History of colon polyps Patient's past records received.  Had a colonoscopy in 2019 with findings of nonbleeding internal hemorrhoids and otherwise normal.  Was advised to have a 5-year recall due to history of colon polyps.  Will schedule today.  - Schedule colonoscopy. I thoroughly discussed the procedure with the patient to include nature of the procedure, alternatives, benefits, and risks (including but not limited to bleeding, infection, perforation, anesthesia/cardiac/pulmonary complications). Patient verbalized understanding and gave verbal consent to proceed with procedure.   GERD Patient having infrequent reflux which is relieved by pantoprazole  as needed.  Will continue.   Camie Furbish, PA-C [facility] 02/08/2024, 10:15 AM  Patient Care Team: Wendolyn Jenkins Jansky, MD as PCP - General (Family Medicine)

## 2024-02-08 NOTE — Patient Instructions (Addendum)
 Your provider has requested that you go to the basement level in our building for lab work 02/20/24. Press B on the elevator. The lab is located at the first door on the left as you exit the elevator.  Due to recent changes in healthcare laws, you may see the results of your imaging and laboratory studies on MyChart before your provider has had a chance to review them.  We understand that in some cases there may be results that are confusing or concerning to you. Not all laboratory results come back in the same time frame and the provider may be waiting for multiple results in order to interpret others.  Please give us  48 hours in order for your provider to thoroughly review all the results before contacting the office for clarification of your results.   You have been scheduled for an abdominal ultrasound with elastography at [facility]iology (1st floor). Your appointment is scheduled for Tuesday, 02/20/24 at 10:00 am. Please arrive 15 minutes prior to your scheduled appointment for registration purposes. Make certain not to have anything to eat or drink 8 hours prior to your procedure. Should you need to reschedule your appointment, you may contact radiology at [phone].  Liver Elastography Various chronic liver diseases such as hepatitis B, C, and fatty liver disease can lead to tissue damage and subsequent scar tissue formation. As the scar tissue accumulates, the liver loses some of its elasticity and becomes stiffer. Liver elastography involves the use of a surface ultrasound probe that delivers a low frequency pulse or shear wave to a small volume of liver tissue under the rib cage. The transmission of the sound wave is completely painless. How Is a Liver Elastography Performed? The liver is located in the right upper abdomen under the rib cage. Patients are asked to lie flat on an examination table. A technician places the FibroScan probe between the ribs on the right side of the lower  chest wall. A series of 10 painless pulses are then applied to the liver. The results are recorded on the equipment and an overall liver stiffness score is generated. This score is then interpreted by a qualified physician to predict the likelihood of advanced fibrosis or cirrhosis.  Patients are asked to wear loose clothing and should not consume any liquids or solids for a minimum of 4 hours before the test to increase the likelihood of obtaining reliable test results. The scan will take 10 to 15 minutes to complete, but patients should plan on being available for 30 minutes to allow time for preparation.  You have been scheduled for a colonoscopy. Please follow written instructions given to you at your visit today.   If you use inhalers (even only as needed), please bring them with you on the day of your procedure.  DO NOT TAKE 7 DAYS PRIOR TO TEST- Trulicity (dulaglutide) Ozempic, Wegovy (semaglutide) Mounjaro (tirzepatide) Bydureon Bcise (exanatide extended release)  DO NOT TAKE 1 DAY PRIOR TO YOUR TEST Rybelsus (semaglutide) Adlyxin (lixisenatide) Victoza (liraglutide) Byetta (exanatide) ___________________________________________________________________________   Thank you for trusting me with your gastrointestinal care!   Camie Furbish, PA-C  _______________________________________________________  If your blood pressure at your visit was 140/90 or greater, please contact your primary care physician to follow up on this.  _______________________________________________________  If you are age 47 or older, your body mass index should be between 23-30. Your Body mass index is 28.72 kg/m. If this is out of the aforementioned range listed, please consider follow up with  your Primary Care Provider.  If you are age 75 or younger, your body mass index should be between 19-25. Your Body mass index is 28.72 kg/m. If this is out of the aformentioned range listed, please consider follow  up with your Primary Care Provider.   ________________________________________________________  The [facility] providers would like to encourage you to use MYCHART to communicate with providers for non-urgent requests or questions.  Due to long hold times on the telephone, sending your provider a message by [facility] may be a faster and more efficient way to get a response.  Please allow 48 business hours for a response.  Please remember that this is for non-urgent requests.  _______________________________________________________  [facility] is using a team-based approach to care.  Your team is made up of your doctor and two to three APPS. Our APPS (Nurse Practitioners and Physician Assistants) work with your physician to ensure care continuity for you. They are fully qualified to address your health concerns and develop a treatment plan. They communicate directly with your gastroenterologist to care for you. Seeing the Advanced Practice Practitioners on your physician's team can help you by facilitating care more promptly, often allowing for earlier appointments, access to diagnostic testing, procedures, and other specialty referrals.

## 2024-02-10 ENCOUNTER — Encounter: Payer: Self-pay | Admitting: Gastroenterology

## 2024-02-13 DIAGNOSIS — M79672 Pain in left foot: Secondary | ICD-10-CM | POA: Diagnosis not present

## 2024-02-13 DIAGNOSIS — M79671 Pain in right foot: Secondary | ICD-10-CM | POA: Diagnosis not present

## 2024-02-13 DIAGNOSIS — R2689 Other abnormalities of gait and mobility: Secondary | ICD-10-CM | POA: Diagnosis not present

## 2024-02-13 DIAGNOSIS — R279 Unspecified lack of coordination: Secondary | ICD-10-CM | POA: Diagnosis not present

## 2024-02-15 DIAGNOSIS — R279 Unspecified lack of coordination: Secondary | ICD-10-CM | POA: Diagnosis not present

## 2024-02-20 ENCOUNTER — Other Ambulatory Visit (INDEPENDENT_AMBULATORY_CARE_PROVIDER_SITE_OTHER)

## 2024-02-20 ENCOUNTER — Ambulatory Visit (HOSPITAL_COMMUNITY)
Admission: RE | Admit: 2024-02-20 | Discharge: 2024-02-20 | Disposition: A | Discharge status: Home / Self Care | Source: Ambulatory Visit | Attending: Gastroenterology | Admitting: Gastroenterology

## 2024-02-20 DIAGNOSIS — Z944 Liver transplant status: Secondary | ICD-10-CM | POA: Diagnosis not present

## 2024-02-20 DIAGNOSIS — K760 Fatty (change of) liver, not elsewhere classified: Secondary | ICD-10-CM

## 2024-02-20 DIAGNOSIS — R748 Abnormal levels of other serum enzymes: Secondary | ICD-10-CM | POA: Diagnosis not present

## 2024-02-20 DIAGNOSIS — K219 Gastro-esophageal reflux disease without esophagitis: Secondary | ICD-10-CM

## 2024-02-20 DIAGNOSIS — Z860100 Personal history of colon polyps, unspecified: Secondary | ICD-10-CM | POA: Diagnosis not present

## 2024-02-20 DIAGNOSIS — R279 Unspecified lack of coordination: Secondary | ICD-10-CM | POA: Diagnosis not present

## 2024-02-20 DIAGNOSIS — M79672 Pain in left foot: Secondary | ICD-10-CM | POA: Diagnosis not present

## 2024-02-20 DIAGNOSIS — K8020 Calculus of gallbladder without cholecystitis without obstruction: Secondary | ICD-10-CM | POA: Insufficient documentation

## 2024-02-20 DIAGNOSIS — R2689 Other abnormalities of gait and mobility: Secondary | ICD-10-CM | POA: Diagnosis not present

## 2024-02-20 DIAGNOSIS — R7989 Other specified abnormal findings of blood chemistry: Secondary | ICD-10-CM | POA: Diagnosis not present

## 2024-02-20 DIAGNOSIS — M79671 Pain in right foot: Secondary | ICD-10-CM | POA: Diagnosis not present

## 2024-02-20 LAB — CBC WITH DIFFERENTIAL/PLATELET
Basophils Absolute: 0 K/uL (ref 0.0–0.1)
Basophils Relative: 1 % (ref 0.0–3.0)
Eosinophils Absolute: 0.2 K/uL (ref 0.0–0.7)
Eosinophils Relative: 4.2 % (ref 0.0–5.0)
HCT: 37.9 % (ref 36.0–46.0)
Hemoglobin: 12.9 g/dL (ref 12.0–15.0)
Lymphocytes Relative: 25.6 % (ref 12.0–46.0)
Lymphs Abs: 1.2 K/uL (ref 0.7–4.0)
MCHC: 34 g/dL (ref 30.0–36.0)
MCV: 87.4 fl (ref 78.0–100.0)
Monocytes Absolute: 0.3 K/uL (ref 0.1–1.0)
Monocytes Relative: 5.7 % (ref 3.0–12.0)
Neutro Abs: 3 K/uL (ref 1.4–7.7)
Neutrophils Relative %: 63.5 % (ref 43.0–77.0)
Platelets: 155 K/uL (ref 150.0–400.0)
RBC: 4.34 Mil/uL (ref 3.87–5.11)
RDW: 13.1 % (ref 11.5–15.5)
WBC: 4.7 K/uL (ref 4.0–10.5)

## 2024-02-20 LAB — COMPREHENSIVE METABOLIC PANEL WITH GFR
ALT: 47 U/L — ABNORMAL HIGH (ref 0–35)
AST: 52 U/L — ABNORMAL HIGH (ref 0–37)
Albumin: 4.8 g/dL (ref 3.5–5.2)
Alkaline Phosphatase: 53 U/L (ref 39–117)
BUN: 11 mg/dL (ref 6–23)
CO2: 28 meq/L (ref 19–32)
Calcium: 9.8 mg/dL (ref 8.4–10.5)
Chloride: 106 meq/L (ref 96–112)
Creatinine, Ser: 0.76 mg/dL (ref 0.40–1.20)
GFR: 76.77 mL/min (ref >60.00)
Glucose, Bld: 98 mg/dL (ref 70–99)
Potassium: 4.1 meq/L (ref 3.5–5.1)
Sodium: 141 meq/L (ref 135–145)
Total Bilirubin: 1 mg/dL (ref 0.2–1.2)
Total Protein: 7 g/dL (ref 6.0–8.3)

## 2024-02-21 ENCOUNTER — Other Ambulatory Visit: Payer: Self-pay | Admitting: Family Medicine

## 2024-02-21 DIAGNOSIS — I10 Essential (primary) hypertension: Secondary | ICD-10-CM

## 2024-02-21 LAB — IGG: IgG (Immunoglobin G), Serum: 569 mg/dL — ABNORMAL LOW (ref 600–1540)

## 2024-02-22 DIAGNOSIS — M79672 Pain in left foot: Secondary | ICD-10-CM | POA: Diagnosis not present

## 2024-02-22 DIAGNOSIS — R2689 Other abnormalities of gait and mobility: Secondary | ICD-10-CM | POA: Diagnosis not present

## 2024-02-22 DIAGNOSIS — R279 Unspecified lack of coordination: Secondary | ICD-10-CM | POA: Diagnosis not present

## 2024-02-22 DIAGNOSIS — M79671 Pain in right foot: Secondary | ICD-10-CM | POA: Diagnosis not present

## 2024-02-23 ENCOUNTER — Ambulatory Visit: Payer: Self-pay | Admitting: Gastroenterology

## 2024-03-01 ENCOUNTER — Other Ambulatory Visit: Payer: Self-pay | Admitting: Family Medicine

## 2024-03-01 DIAGNOSIS — E034 Atrophy of thyroid (acquired): Secondary | ICD-10-CM

## 2024-03-05 DIAGNOSIS — M79672 Pain in left foot: Secondary | ICD-10-CM | POA: Diagnosis not present

## 2024-03-05 DIAGNOSIS — R2689 Other abnormalities of gait and mobility: Secondary | ICD-10-CM | POA: Diagnosis not present

## 2024-03-05 DIAGNOSIS — M79671 Pain in right foot: Secondary | ICD-10-CM | POA: Diagnosis not present

## 2024-03-05 DIAGNOSIS — R279 Unspecified lack of coordination: Secondary | ICD-10-CM | POA: Diagnosis not present

## 2024-03-08 ENCOUNTER — Encounter: Admitting: Family Medicine

## 2024-03-10 NOTE — Progress Notes (Unsigned)
 [facility] History and Physical   Primary Care Physician:  Wendolyn Jenkins Jansky, MD   Reason for Procedure:    Encounter Diagnosis  Name Primary?   History of colonic polyps Yes     Plan:    Colonoscopy     HPI: Rebecca Lutz is a 73 y.o. female presenting for surveillance colonoscopy exam.  Last colonoscopy 2019 in [place] without polyps, only show only showing internal hemorrhoids otherwise normal.  But the patient has a personal history of colon polyps and a repeat colonoscopy was recommended 5 years after the last by her previous gastroenterologist.   Past Medical History:  Diagnosis Date   Allergy 1956   allergy shots five yrs   Anxiety    Asthma 1956   Bipolar disorder (HCC)    CAD (coronary artery disease)    MI 2007   Cataract    Depression 1993   Heart attack (HCC)    Heart murmur    High cholesterol    Hypertension    Idiopathic urticaria    NASH (nonalcoholic steatohepatitis)    OSA on CPAP    Osteoporosis    previosly treated with bonivas   Peripheral neuropathy    Plantar fasciitis    Sciatica    Sleep apnea    Thyroid  disease     Past Surgical History:  Procedure Laterality Date   APPENDECTOMY  1963   DILATION AND CURETTAGE OF UTERUS     idiopathic urticaria     LEG SURGERY Right 2020   poss achilles for plantar fasciitis   NASH     osteoporosis     TONSILLECTOMY       Current Outpatient Medications  Medication Sig Dispense Refill   amLODipine  (NORVASC ) 5 MG tablet Take 1 tablet (5 mg total) by mouth daily. 90 tablet 0   ARIPiprazole  (ABILIFY ) 2 MG tablet Take 1 tablet (2 mg total) by mouth daily. 90 tablet 1   aspirin EC 81 MG tablet Take 81 mg by mouth daily.     ezetimibe  (ZETIA ) 10 MG tablet Take 1 tablet (10 mg total) by mouth daily. 90 tablet 3   gabapentin  (NEURONTIN ) 300 MG capsule Take 1 capsule (300 mg total) by mouth at bedtime. 90 capsule 2   levothyroxine  (SYNTHROID ) 100 MCG tablet Take 1 tablet (100 mcg  total) by mouth daily. 90 tablet 1   metoprolol  succinate (TOPROL -XL) 50 MG 24 hr tablet Take 1.5 tablets (75 mg total) by mouth daily. 135 tablet 3   rosuvastatin  (CRESTOR ) 10 MG tablet Take 1 tablet (10 mg total) by mouth daily. 90 tablet 1   sertraline  (ZOLOFT ) 100 MG tablet Take 1 tablet (100 mg total) by mouth daily. 90 tablet 1   Misc Natural Products (LUTEIN VISION BLEND PO) Take by mouth daily.     pantoprazole  (PROTONIX ) 40 MG tablet Take 1 tablet (40 mg total) by mouth daily. 30 tablet 3   polyethylene glycol powder (GLYCOLAX /MIRALAX ) 17 GM/SCOOP powder Take 17 g by mouth in the morning and at bedtime. 3350 g 0   PREVIDENT 5000 BOOSTER PLUS 1.1 % PSTE Place onto teeth.     Current Facility-Administered Medications  Medication Dose Route Frequency Provider Last Rate Last Admin   0.9 %  sodium chloride infusion  500 mL Intravenous Continuous Avram Lupita BRAVO, MD        Allergies as of 03/11/2024 - Review Complete 03/11/2024  Allergen Reaction Noted   Penicillins Rash 07/15/2019    Family History  Problem Relation Age of Onset   Lung cancer Mother    COPD Mother    Heart attack Mother    Heart failure Mother    Cancer Mother    Varicose Veins Mother    Suicidality Father        ? bipolar disorder   Depression Father    Alcohol abuse Father    Cancer Daughter    Liver disease Neg Hx    Colon cancer Neg Hx    Esophageal cancer Neg Hx     Social History   Socioeconomic History   Marital status: Widowed    Spouse name: Not on file   Number of children: 3   Years of education: Not on file   Highest education level: Some college, no degree  Occupational History   Occupation: retired  Tobacco Use   Smoking status: Never    Passive exposure: Never   Smokeless tobacco: Never  Vaping Use   Vaping status: Never Used  Substance and Sexual Activity   Alcohol use: Yes    Comment: occasional   Drug use: Never   Sexual activity: Not Currently  Other Topics Concern    Not on file  Social History Narrative   2 sons, 1 daughter (all in [place])-5 grands   Widowed 2020   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   Completed some college   Enjoys yard work, spending time with her grandchildren, painting, coloring and needlepoint   Lives in independent living at [facility]   Social Drivers of Health   Financial Resource Strain: Low Risk  (03/04/2024)   Overall Financial Resource Strain (CARDIA)    Difficulty of Paying Living Expenses: Not hard at all  Food Insecurity: No Food Insecurity (03/04/2024)   Hunger Vital Sign    Worried About Running Out of Food in the Last Year: Never true    Ran Out of Food in the Last Year: Never true  Transportation Needs: No Transportation Needs (03/04/2024)   PRAPARE - Administrator, Civil Service (Medical): No    Lack of Transportation (Non-Medical): No  Physical Activity: Inactive (10/26/2023)   Exercise Vital Sign    Days of Exercise per Week: 0 days    Minutes of Exercise per Session: Not on file  Stress: Stress Concern Present (10/26/2023)   [employer] of Occupational Health - Occupational Stress Questionnaire    Feeling of Stress: To some extent  Social Connections: Moderately Integrated (03/04/2024)   Social Connection and Isolation Panel    Frequency of Communication with Friends and Family: Twice a week    Frequency of Social Gatherings with Friends and Family: Once a week    Attends Religious Services: More than 4 times per year    Active Member of [employer] or Organizations: Yes    Attends Banker Meetings: 1 to 4 times per year    Marital Status: Widowed  Intimate Partner Violence: Not At Risk (10/19/2023)   Humiliation, Afraid, Rape, and Kick questionnaire    Fear of Current or Ex-Partner: No    Emotionally Abused: No    Physically Abused: No    Sexually Abused: No    Review of Systems:  All other review of systems negative except as  mentioned in the HPI.  Physical Exam: Vital signs BP 122/72   Pulse 61   Temp (!) 97.5 F (36.4 C)   Ht 5' 1 (1.549 m)  Wt 152 lb (68.9 kg)   SpO2 97%   BMI 28.72 kg/m   General:   Alert,  Well-developed, well-nourished, pleasant and cooperative in NAD Lungs:  Clear throughout to auscultation.   Heart:  Regular rate and rhythm; no murmurs, clicks, rubs,  or gallops. Abdomen:  Soft, nontender and nondistended. Normal bowel sounds.   Neuro/Psych:  Alert and cooperative. Normal mood and affect. A and O x 3   @Sicilia Killough  CHARLENA Commander, MD, [facility] [phone] (pager) 03/11/2024 10:20 AM@

## 2024-03-11 ENCOUNTER — Encounter: Payer: Self-pay | Admitting: Internal Medicine

## 2024-03-11 ENCOUNTER — Ambulatory Visit: Admitting: Internal Medicine

## 2024-03-11 VITALS — BP 91/51 | HR 53 | Temp 97.5°F | Resp 14 | Ht 61.0 in | Wt 152.0 lb

## 2024-03-11 DIAGNOSIS — Z860100 Personal history of colon polyps, unspecified: Secondary | ICD-10-CM

## 2024-03-11 DIAGNOSIS — D120 Benign neoplasm of cecum: Secondary | ICD-10-CM | POA: Diagnosis not present

## 2024-03-11 DIAGNOSIS — Z1211 Encounter for screening for malignant neoplasm of colon: Secondary | ICD-10-CM | POA: Diagnosis not present

## 2024-03-11 MED ORDER — SODIUM CHLORIDE 0.9 % IV SOLN: 500.0000 mL | INTRAVENOUS | Status: DC

## 2024-03-11 NOTE — Patient Instructions (Addendum)
 I found and removed 1 small polyp from the colon.  It looks benign.  The polyp will be analyzed.  I doubt you would need another routine repeat colonoscopy, I will let you know.     Regarding your fatty liver, please try to stop drinking sugary beverages like sweet tea.  Excess carbohydrates particularly things like sugar or high fructose corn syrup are definitely linked to fatty liver issues, as we discussed.  I appreciate the opportunity to care for you. Lupita CHARLENA Commander, MD, FACG   YOU HAD AN ENDOSCOPIC PROCEDURE TODAY AT THE [facility]:   Refer to the procedure report that was given to you for any specific questions about what was found during the examination.  If the procedure report does not answer your questions, please call your gastroenterologist to clarify.  If you requested that your care partner not be given the details of your procedure findings, then the procedure report has been included in a sealed envelope for you to review at your convenience later.  YOU SHOULD EXPECT: Some feelings of bloating in the abdomen. Passage of more gas than usual.  Walking can help get rid of the air that was put into your GI tract during the procedure and reduce the bloating. If you had a lower endoscopy (such as a colonoscopy or flexible sigmoidoscopy) you may notice spotting of blood in your stool or on the toilet paper. If you underwent a bowel prep for your procedure, you may not have a normal bowel movement for a few days.  Please Note:  You might notice some irritation and congestion in your nose or some drainage.  This is from the oxygen used during your procedure.  There is no need for concern and it should clear up in a day or so.  SYMPTOMS TO REPORT IMMEDIATELY:  Following lower endoscopy (colonoscopy or flexible sigmoidoscopy):  Excessive amounts of blood in the stool  Significant tenderness or worsening of abdominal pains  Swelling of the abdomen that is new, acute  Fever of  100F or higher  For urgent or emergent issues, a gastroenterologist can be reached at any hour by calling [phone]. Do not use MyChart messaging for urgent concerns.    DIET:  We do recommend a small meal at first, but then you may proceed to your regular diet.  Drink plenty of fluids but you should avoid alcoholic beverages for 24 hours.  ACTIVITY:  You should plan to take it easy for the rest of today and you should NOT DRIVE or use heavy machinery until tomorrow (because of the sedation medicines used during the test).    FOLLOW UP: Our staff will call the number listed on your records the next business day following your procedure.  We will call around 7:15- 8:00 am to check on you and address any questions or concerns that you may have regarding the information given to you following your procedure. If we do not reach you, we will leave a message.     If any biopsies were taken you will be contacted by phone or by letter within the next 1-3 weeks.  Please call us  at [phone] if you have not heard about the biopsies in 3 weeks.    SIGNATURES/CONFIDENTIALITY: You and/or your care partner have signed paperwork which will be entered into your electronic medical record.  These signatures attest to the fact that that the information above on your After Visit Summary has been reviewed and is understood.  Full responsibility of the confidentiality of this discharge information lies with you and/or your care-partner.

## 2024-03-11 NOTE — Progress Notes (Signed)
 Sedate, gd SR, tolerated procedure well, VSS, report to RN

## 2024-03-11 NOTE — Progress Notes (Signed)
 Called to room to assist during endoscopic procedure.  Patient ID and intended procedure confirmed with present staff. Received instructions for my participation in the procedure from the performing physician.

## 2024-03-11 NOTE — Op Note (Signed)
 [facility] Patient Name: Rebecca Lutz Procedure Date: 03/11/2024 10:13 AM MRN: [id number] Endoscopist: Lupita FORBES Commander , MD, [id number] Age: 73 Referring MD:  Date of Birth: 12/27/1950 Gender: Female Account #: [id number] Procedure:                Colonoscopy Indications:              High risk colon cancer surveillance: Personal                            history of colonic polyps, Last colonoscopy: 2019 Medicines:                Monitored Anesthesia Care Procedure:                Pre-Anesthesia Assessment:                           - Prior to the procedure, a History and Physical                            was performed, and patient medications and                            allergies were reviewed. The patient's tolerance of                            previous anesthesia was also reviewed. The risks                            and benefits of the procedure and the sedation                            options and risks were discussed with the patient.                            All questions were answered, and informed consent                            was obtained. Prior Anticoagulants: The patient has                            taken no anticoagulant or antiplatelet agents. ASA                            Grade Assessment: III - A patient with severe                            systemic disease. After reviewing the risks and                            benefits, the patient was deemed in satisfactory                            condition to undergo the procedure.  After obtaining informed consent, the colonoscope                            was passed under direct vision. Throughout the                            procedure, the patient's blood pressure, pulse, and                            oxygen saturations were monitored continuously. The                            Olympus Scope SN [id number] was introduced through the                             anus and advanced to the the cecum, identified by                            appendiceal orifice and ileocecal valve. The                            colonoscopy was performed without difficulty. The                            patient tolerated the procedure well. The quality                            of the bowel preparation was good. The ileocecal                            valve, appendiceal orifice, and rectum were                            photographed. Scope In: 10:31:35 AM Scope Out: 10:46:01 AM Scope Withdrawal Time: 0 hours 10 minutes 16 seconds  Total Procedure Duration: 0 hours 14 minutes 26 seconds  Findings:                 The perianal and digital rectal examinations were                            normal.                           A 7 mm polyp was found in the cecum. The polyp was                            sessile. The polyp was removed with a cold snare.                            Resection and retrieval were complete. Verification                            of patient identification for the specimen was  done. Estimated blood loss was minimal.                           The exam was otherwise without abnormality on                            direct and retroflexion views. Complications:            No immediate complications. Estimated Blood Loss:     Estimated blood loss was minimal. Impression:               - One 7 mm polyp in the cecum, removed with a cold                            snare. Resected and retrieved.                           - The examination was otherwise normal on direct                            and retroflexion views. Recommendation:           - Patient has a contact number available for                            emergencies. The signs and symptoms of potential                            delayed complications were discussed with the                            patient. Return to normal activities tomorrow.                             Written discharge instructions were provided to the                            patient.                           - Continue present medications.                           - Await pathology results.                           - No recommendation at this time regarding repeat                            colonoscopy due to age. Unlikely to recommend                            repeat (no polyps on last 2 colonoscopy exams).                           - reduce and hopefully eliminate sugary beverages  to help MASH - she drinks 6 glasses of sweet tea                            daily. Lupita FORBES Commander, MD 03/11/2024 10:55:50 AM This report has been signed electronically.

## 2024-03-12 ENCOUNTER — Telehealth: Payer: Self-pay

## 2024-03-12 NOTE — Telephone Encounter (Signed)
  Follow up Call-     03/11/2024    9:13 AM  Call back number  Post procedure Call Back phone  # [phone]  Permission to leave phone message Yes     Patient questions:  Do you have a fever, pain , or abdominal swelling? No. Pain Score  0 *  Have you tolerated food without any problems? Yes.    Have you been able to return to your normal activities? Yes.    Do you have any questions about your discharge instructions: Diet   No. Medications  No. Follow up visit  No.  Do you have questions or concerns about your Care? No.  Actions: * If pain score is 4 or above: No action needed, pain <4.

## 2024-03-13 ENCOUNTER — Encounter: Admitting: Internal Medicine

## 2024-03-13 LAB — SURGICAL PATHOLOGY

## 2024-03-18 ENCOUNTER — Other Ambulatory Visit: Payer: Self-pay | Admitting: Family Medicine

## 2024-03-18 ENCOUNTER — Other Ambulatory Visit: Payer: Self-pay

## 2024-03-18 DIAGNOSIS — F319 Bipolar disorder, unspecified: Secondary | ICD-10-CM

## 2024-03-18 DIAGNOSIS — E7800 Pure hypercholesterolemia, unspecified: Secondary | ICD-10-CM

## 2024-03-18 DIAGNOSIS — F419 Anxiety disorder, unspecified: Secondary | ICD-10-CM

## 2024-03-18 MED ORDER — ROSUVASTATIN CALCIUM 10 MG PO TABS: 10.0000 mg | ORAL_TABLET | Freq: Every day | ORAL | Status: DC

## 2024-03-18 MED ORDER — ARIPIPRAZOLE 2 MG PO TABS: 2.0000 mg | ORAL_TABLET | Freq: Every day | ORAL | Status: DC

## 2024-03-20 ENCOUNTER — Ambulatory Visit: Payer: Self-pay | Admitting: Internal Medicine

## 2024-03-30 ENCOUNTER — Other Ambulatory Visit: Payer: Self-pay | Admitting: Family Medicine

## 2024-03-30 DIAGNOSIS — I10 Essential (primary) hypertension: Secondary | ICD-10-CM

## 2024-04-08 ENCOUNTER — Ambulatory Visit: Admitting: Family Medicine

## 2024-04-08 ENCOUNTER — Encounter: Payer: Self-pay | Admitting: Family Medicine

## 2024-04-08 VITALS — BP 128/80 | HR 63 | Ht 61.0 in | Wt 153.0 lb

## 2024-04-08 DIAGNOSIS — G609 Hereditary and idiopathic neuropathy, unspecified: Secondary | ICD-10-CM | POA: Diagnosis not present

## 2024-04-08 NOTE — Patient Instructions (Signed)
 Thank you for coming in today.   Glad you are doing better  Check back as needed

## 2024-04-08 NOTE — Progress Notes (Signed)
   I, Leotis Batter, CMA acting as a scribe for Artist Lloyd, MD.  Rebecca Lutz is a 73 y.o. female who presents to [facility] today for 24-month f/u bilat LE peripheral neuropathy. Pt was last seen by Dr. Lloyd on 01/05/24 and was advised to increase gabapentin  to 300mg  at bedtime and cont PT at [facility].  Today, pt reports tolerating increased dose of Gabapentin  at bedtime, has been beneficial for sx. Pt has been d/c from PT d/t reaching max medical improvement. Things are going well overall. Has made adjustments to the way that she is walking. Continues HEP provided by PT.   Dx testing: 12/22/23 NCV study   Pertinent review of systems: No fever or chills  Relevant historical information: Peripheral neuropathy and hypertension.   Exam:  BP 128/80   Pulse 63   Ht 5' 1 (1.549 m)   Wt 153 lb (69.4 kg)   SpO2 97%   BMI 28.91 kg/m  General: Well Developed, well nourished, and in no acute distress.   MSK: Normal gait    Lab and Radiology Results No results found for this or any previous visit (from the past 72 hours). No results found.     Assessment and Plan: 73 y.o. female with neuropathy.  Clinically doing pretty well with home exercise program and gabapentin .  She is happy with how things are going.  Check back as needed.   PDMP not reviewed this encounter. No orders of the defined types were placed in this encounter.  No orders of the defined types were placed in this encounter.    Discussed warning signs or symptoms. Please see discharge instructions. Patient expresses understanding.   The above documentation has been reviewed and is accurate and complete Artist Lloyd, M.D.

## 2024-04-09 ENCOUNTER — Other Ambulatory Visit: Payer: Self-pay

## 2024-04-09 ENCOUNTER — Other Ambulatory Visit: Payer: Self-pay | Admitting: Family Medicine

## 2024-04-09 DIAGNOSIS — F319 Bipolar disorder, unspecified: Secondary | ICD-10-CM

## 2024-04-09 DIAGNOSIS — F419 Anxiety disorder, unspecified: Secondary | ICD-10-CM

## 2024-04-09 MED ORDER — SERTRALINE HCL 100 MG PO TABS: 100.0000 mg | ORAL_TABLET | Freq: Every day | ORAL | 1 refills | Status: AC

## 2024-05-16 ENCOUNTER — Other Ambulatory Visit: Payer: Self-pay | Admitting: Family Medicine

## 2024-05-16 DIAGNOSIS — Z1231 Encounter for screening mammogram for malignant neoplasm of breast: Secondary | ICD-10-CM

## 2024-05-24 ENCOUNTER — Other Ambulatory Visit: Payer: Self-pay

## 2024-05-24 ENCOUNTER — Other Ambulatory Visit: Payer: Self-pay | Admitting: Family Medicine

## 2024-05-24 DIAGNOSIS — I10 Essential (primary) hypertension: Secondary | ICD-10-CM

## 2024-05-24 MED ORDER — AMLODIPINE BESYLATE 5 MG PO TABS: 5.0000 mg | ORAL_TABLET | Freq: Every day | ORAL | 0 refills | Status: DC

## 2024-06-06 ENCOUNTER — Encounter: Payer: Self-pay | Admitting: Family Medicine

## 2024-06-06 ENCOUNTER — Ambulatory Visit: Payer: Self-pay | Admitting: Family Medicine

## 2024-06-06 ENCOUNTER — Ambulatory Visit: Admitting: Family Medicine

## 2024-06-06 VITALS — BP 138/74 | HR 70 | Temp 97.3°F | Ht 61.0 in | Wt 152.2 lb

## 2024-06-06 DIAGNOSIS — I10 Essential (primary) hypertension: Secondary | ICD-10-CM

## 2024-06-06 DIAGNOSIS — I2510 Atherosclerotic heart disease of native coronary artery without angina pectoris: Secondary | ICD-10-CM | POA: Diagnosis not present

## 2024-06-06 DIAGNOSIS — Z0000 Encounter for general adult medical examination without abnormal findings: Secondary | ICD-10-CM

## 2024-06-06 DIAGNOSIS — F3178 Bipolar disorder, in full remission, most recent episode mixed: Secondary | ICD-10-CM

## 2024-06-06 DIAGNOSIS — E034 Atrophy of thyroid (acquired): Secondary | ICD-10-CM | POA: Diagnosis not present

## 2024-06-06 DIAGNOSIS — E7800 Pure hypercholesterolemia, unspecified: Secondary | ICD-10-CM

## 2024-06-06 LAB — COMPREHENSIVE METABOLIC PANEL WITH GFR
ALT: 47 U/L — ABNORMAL HIGH (ref 3–35)
AST: 46 U/L — ABNORMAL HIGH (ref 5–37)
Albumin: 4.5 g/dL (ref 3.5–5.2)
Alkaline Phosphatase: 49 U/L (ref 39–117)
BUN: 10 mg/dL (ref 6–23)
CO2: 29 meq/L (ref 19–32)
Calcium: 9.8 mg/dL (ref 8.4–10.5)
Chloride: 104 meq/L (ref 96–112)
Creatinine, Ser: 0.76 mg/dL (ref 0.40–1.20)
GFR: 76.61 mL/min
Glucose, Bld: 90 mg/dL (ref 70–99)
Potassium: 4 meq/L (ref 3.5–5.1)
Sodium: 140 meq/L (ref 135–145)
Total Bilirubin: 0.8 mg/dL (ref 0.2–1.2)
Total Protein: 6.8 g/dL (ref 6.0–8.3)

## 2024-06-06 LAB — CBC WITH DIFFERENTIAL/PLATELET
Basophils Absolute: 0.1 10*3/uL (ref 0.0–0.1)
Basophils Relative: 1.1 % (ref 0.0–3.0)
Eosinophils Absolute: 0.2 10*3/uL (ref 0.0–0.7)
Eosinophils Relative: 4.7 % (ref 0.0–5.0)
HCT: 36.8 % (ref 36.0–46.0)
Hemoglobin: 12.5 g/dL (ref 12.0–15.0)
Lymphocytes Relative: 26 % (ref 12.0–46.0)
Lymphs Abs: 1.2 10*3/uL (ref 0.7–4.0)
MCHC: 34 g/dL (ref 30.0–36.0)
MCV: 87 fl (ref 78.0–100.0)
Monocytes Absolute: 0.3 10*3/uL (ref 0.1–1.0)
Monocytes Relative: 7.2 % (ref 3.0–12.0)
Neutro Abs: 2.8 10*3/uL (ref 1.4–7.7)
Neutrophils Relative %: 61 % (ref 43.0–77.0)
Platelets: 122 10*3/uL — ABNORMAL LOW (ref 150.0–400.0)
RBC: 4.24 Mil/uL (ref 3.87–5.11)
RDW: 13.8 % (ref 11.5–15.5)
WBC: 4.6 10*3/uL (ref 4.0–10.5)

## 2024-06-06 LAB — LIPID PANEL
Cholesterol: 122 mg/dL (ref 28–200)
HDL: 49.9 mg/dL
LDL Cholesterol: 40 mg/dL (ref 10–99)
NonHDL: 71.68
Total CHOL/HDL Ratio: 2
Triglycerides: 156 mg/dL — ABNORMAL HIGH (ref 10.0–149.0)
VLDL: 31.2 mg/dL (ref 0.0–40.0)

## 2024-06-06 LAB — TSH: TSH: 0.58 u[IU]/mL (ref 0.35–5.50)

## 2024-06-06 MED ORDER — AMLODIPINE BESYLATE 5 MG PO TABS: 5.0000 mg | ORAL_TABLET | Freq: Every day | ORAL | 1 refills | Status: AC

## 2024-06-06 NOTE — Progress Notes (Signed)
 Labs at goal/stable The platelets are a little low-probably related to fatty liver.  Will continue to monitor but if bruising or excess bleeding, let us  know

## 2024-06-06 NOTE — Progress Notes (Signed)
 " Phone [phone]   Subjective:   Patient is a 74 y.o. female presenting for annual physical.    Chief Complaint  Patient presents with   Annual Exam    Pt is here for CPE    Discussed the use of AI scribe software for clinical note transcription with the patient, who gave verbal consent to proceed.  History of Present Illness Rebecca Lutz is a 74 year old female who presents for an annual physical exam and follow-up on her chronic conditions.  She has not undergone any new surgeries or changes in her family history since her last visit. A recent colonoscopy was uneventful.  Her current medications include Synthroid  100 mcg once daily for her thyroid  condition, amlodipine  5 mg and metoprolol  75 mg daily for hypertension, rosuvastatin  10 mg and Zetia  10 mg daily for cholesterol management, Abilify  2 mg and Zoloft  100 mg daily for bipolar disorder, and gabapentin  for neuropathy. She takes aspirin daily for heart health. She uses pantoprazole  for reflux only when symptoms become severe. She no longer uses Miralax  for constipation, as the issue has resolved. All are stable.  No SI, no adverse affects.  She mentions a balance issue when standing still and looking up with her eyes closed, which causes her to stumble backwards. In terms of physical activity, she engages in walking and participates in a balance class.  No major headaches, dizziness, syncope, diplopia, rhinorrhea, congestion, sore throat, chest pains, palpitations, peripheral edema, coughing, wheezing, dyspnea, vomiting, diarrhea, heartburn, constipation, dysuria, myalgias, or arthralgias. Her mood is stable, and she denies any suicidal ideation.  She resides at  friend's home.    See problem oriented charting- ROS- ROS: Gen: no fever, chills  Skin: no rash, itching ENT: no ear pain, ear drainage, nasal congestion, rhinorrhea, sinus pressure, sore throat Eyes: no blurry vision, double vision Resp: no  cough, wheeze,SOB CV: no CP, palpitations, LE edema,  GI: no heartburn, n/v/d/c, abd pain GU: no dysuria, urgency, frequency, hematuria MSK: no joint pain, myalgias, back pain Neuro: no dizziness, headache, weakness, vertigo Psych:HPI  The following were reviewed and entered/updated in epic: Past Medical History:  Diagnosis Date   Allergy 1956   allergy shots five yrs   Anxiety    Asthma 1956   Bipolar disorder (HCC)    CAD (coronary artery disease)    MI 2007   Cataract    Depression 1993   Heart attack (HCC)    Heart murmur    High cholesterol    Hypertension    Idiopathic urticaria    NASH (nonalcoholic steatohepatitis)    OSA on CPAP    Osteoporosis    previosly treated with bonivas   Peripheral neuropathy    Plantar fasciitis    Sciatica    Sleep apnea    Thyroid  disease    Patient Active Problem List   Diagnosis Date Noted   LLQ abdominal pain 12/22/2022   Dysuria 02/11/2021   Plantar fasciitis    High cholesterol    Anxiety    Asthma    Bipolar disorder (HCC)    CAD (coronary artery disease)    Depression    Osteoporosis    Peripheral neuropathy    Thyroid  disease    Sciatica    Hypothyroidism 11/08/2019   NASH (nonalcoholic steatohepatitis) 11/08/2019   Hyperlipidemia 11/08/2019   Essential hypertension 11/08/2019   Bipolar depression (HCC) 11/08/2019   Seasonal allergies 11/08/2019   Idiopathic urticaria 11/08/2019   Nondisplaced fracture of  fifth metatarsal bone, left foot, initial encounter for closed fracture 10/22/2019   OSA on CPAP 09/18/2019   Insomnia 09/18/2019   Past Surgical History:  Procedure Laterality Date   APPENDECTOMY  1963   DILATION AND CURETTAGE OF UTERUS     idiopathic urticaria     LEG SURGERY Right 2020   poss achilles for plantar fasciitis   NASH     osteoporosis     TONSILLECTOMY      Family History  Problem Relation Age of Onset   Lung cancer Mother    COPD Mother    Heart attack Mother    Heart failure  Mother    Cancer Mother    Varicose Veins Mother    Suicidality Father        ? bipolar disorder   Depression Father    Alcohol abuse Father    Cancer Daughter    Liver disease Neg Hx    Colon cancer Neg Hx    Esophageal cancer Neg Hx     Medications- reviewed and updated Current Outpatient Medications  Medication Sig Dispense Refill   ARIPiprazole  (ABILIFY ) 2 MG tablet Take 1 tablet (2 mg total) by mouth daily. 90 tablet 1   aspirin EC 81 MG tablet Take 81 mg by mouth daily.     ezetimibe  (ZETIA ) 10 MG tablet Take 1 tablet (10 mg total) by mouth daily. 90 tablet 3   gabapentin  (NEURONTIN ) 300 MG capsule Take 1 capsule (300 mg total) by mouth at bedtime. 90 capsule 2   levothyroxine  (SYNTHROID ) 100 MCG tablet Take 1 tablet (100 mcg total) by mouth daily. 90 tablet 1   metoprolol  succinate (TOPROL -XL) 50 MG 24 hr tablet Take 1.5 tablets (75 mg total) by mouth daily. 135 tablet 3   Misc Natural Products (LUTEIN VISION BLEND PO) Take by mouth daily.     pantoprazole  (PROTONIX ) 40 MG tablet Take 1 tablet (40 mg total) by mouth daily. 30 tablet 3   PREVIDENT 5000 BOOSTER PLUS 1.1 % PSTE Place onto teeth.     rosuvastatin  (CRESTOR ) 10 MG tablet Take 1 tablet (10 mg total) by mouth daily. 90 tablet 1   sertraline  (ZOLOFT ) 100 MG tablet Take 1 tablet (100 mg total) by mouth daily. 90 tablet 1   amLODipine  (NORVASC ) 5 MG tablet Take 1 tablet (5 mg total) by mouth daily. 90 tablet 1   No current facility-administered medications for this visit.    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   Completed some college   Enjoys yard work, spending time with her grandchildren, painting, coloring and needlepoint   Lives in independent living at [facility]   Objective  Objective:  BP 138/74 (BP Location:  Left Arm, Patient Position: Sitting, Cuff Size: Normal)   Pulse 70   Temp (!) 97.3 F (36.3 C) (Temporal)   Ht 5' 1 (1.549 m)   Wt 152 lb 4 oz (69.1 kg)   SpO2 98%   BMI 28.77 kg/m  Physical Exam  Gen: WDWN NAD HEENT: NCAT, conjunctiva not injected, sclera nonicteric TM WNL B, OP moist, no exudates  NECK:  supple, no thyromegaly, no nodes, no carotid bruits CARDIAC: RRR, S1S2+, no murmur. DP 2+B LUNGS: CTAB. No wheezes ABDOMEN:  BS+, soft, NTND, No HSM, no masses  EXT:  no edema MSK: no gross abnormalities. MS 5/5 all 4 NEURO: A&O x3.  CN II-XII intact.  PSYCH: normal mood. Good eye contact     Assessment and Plan   Health Maintenance counseling: 1. Anticipatory guidance: Patient counseled regarding regular dental exams q6 months, eye exams,  avoiding smoking and second hand smoke, limiting alcohol to 1 beverage per day, no illicit drugs.   2. Risk factor reduction:  Advised patient of need for regular exercise and diet rich and fruits and vegetables to reduce risk of heart attack and stroke. Exercise- active.  Wt Readings from Last 3 Encounters:  06/06/24 152 lb 4 oz (69.1 kg)  04/08/24 153 lb (69.4 kg)  03/11/24 152 lb (68.9 kg)   3. Immunizations/screenings/ancillary studies Immunization History  Administered Date(s) Administered   Fluad Quad(high Dose 65+) 02/20/2019, 02/10/2021   Fluzone Influenza virus vaccine,trivalent (IIV3), split virus 03/21/2005, 03/03/2006, 02/19/2007, 01/18/2008, 02/02/2009, 02/29/2012, 01/20/2014, 02/10/2015   Hepatitis A 02/21/2013, 08/22/2013   Hepatitis B 02/21/2013, 03/21/2013, 06/13/2013   Hepb-cpg 02/08/2024   INFLUENZA, HIGH DOSE SEASONAL PF 02/28/2023, 02/22/2024   Influenza, Seasonal, Injecte, Preservative Fre 01/31/2013   Influenza,inj,Quad PF,6+ Mos 02/20/2019   Influenza-Unspecified 04/05/2000, 02/21/2001, 05/14/2003, 03/18/2004, 02/18/2020   Novel Infuenza-h1n1-09 03/31/2008   PFIZER(Purple Top)SARS-COV-2 Vaccination  06/23/2019, 07/16/2019, 03/04/2020, 11/10/2020   Pfizer Covid Bivalent Pediatric Vaccine(68mos to <105yrs) 11/06/2021   Pfizer Covid-19 Vaccine Bivalent Booster 89yrs & up 02/10/2021, 09/24/2021   Pfizer(Comirnaty)Fall Seasonal Vaccine 12 years and older 02/01/2024   Pneumococcal Conjugate-13 01/14/2014   Pneumococcal Polysaccharide-23 05/11/2020   Respiratory Syncytial Virus Vaccine,Recomb Aduvanted(Arexvy) 03/29/2023   Td 06/08/1990   Tdap 05/15/2007, 09/21/2020   Unspecified SARS-COV-2 Vaccination 02/22/2023   Zoster Recombinant(Shingrix) 01/30/2017, 04/01/2017   Zoster, Live 07/17/2012   There are no preventive care reminders to display for this patient.   4. Cervical cancer screening- age 45. Breast cancer screening-  mammogram sch 2/16 6. Colon cancer screening - utd, no further 7. Skin cancer screening- advised regular sunscreen use. Denies worrisome, changing, or new skin lesions.  8. Birth control/STD check- menopause 9. Osteoporosis screening- done 10. Smoking associated screening - non smoker  Wellness examination  Coronary artery disease involving native coronary artery of native heart without angina pectoris  Essential hypertension -     CBC with Differential/Platelet -     Comprehensive metabolic panel with GFR -     amLODIPine  Besylate; Take 1 tablet (5 mg total) by mouth daily.  Dispense: 90 tablet; Refill: 1  Hypothyroidism due to acquired atrophy of thyroid  -     TSH  Pure hypercholesterolemia -     Comprehensive metabolic panel with GFR -     Lipid panel  Bipolar disorder, in full remission, most recent episode mixed    Assessment and Plan Assessment & Plan Coronary artery disease  - chronic Well-managed with no recent episodes of chest pain, palpitations, or dyspnea. Continue aspirin daily and statin,BB.  Essential hypertension  chronic Well-controlled with current medication regimen. Continue amlodipine  5 mg and metoprolol  75 mg  daily.  Hypothyroidism   Well-managed with current medication regimen. Continue Synthroid  100 mcg daily.  Pure hypercholesterolemia   Managed with current medication regimen. Continue rosuvastatin  10 mg and Zetia  10 mg daily.  Bipolar disorder, in full remission   In full remission with no recent episodes or mood disturbances. Continue Abilify  2 mg and Zoloft  100 mg daily.  Peripheral neuropathy   Managed with gabapentin . Continue gabapentin  as prescribed.  General Health Maintenance  Routine health maintenance is up to date. Recent colonoscopy was normal, and no further colonoscopies are needed. Vaccinations are current. Continue regular exercise, including walking and balance classes. Maintain a healthy diet. Avoid alcohol and drugs. Wear seatbelt and avoid driving on ice.     Recommended follow up: Return in about 6 months (around 12/04/2024) for chronic follow-up.  Lab/Order associations:+ fasting  Jenkins CHRISTELLA Carrel, MD      [1]  Allergies Allergen Reactions   Penicillins Rash   "

## 2024-06-06 NOTE — Patient Instructions (Signed)

## 2024-06-07 NOTE — Progress Notes (Signed)
Pt has read results.

## 2024-06-24 ENCOUNTER — Ambulatory Visit

## 2024-08-14 ENCOUNTER — Ambulatory Visit: Admitting: Nurse Practitioner

## 2024-10-24 ENCOUNTER — Ambulatory Visit

## 2024-12-04 ENCOUNTER — Ambulatory Visit: Admitting: Family Medicine
